# Patient Record
Sex: Male | Born: 1960
Health system: Southern US, Community
[De-identification: ages and names within clinical notes are randomized; demographics above are authoritative.]

## PROBLEM LIST (undated history)

## (undated) DIAGNOSIS — E785 Hyperlipidemia, unspecified: Secondary | ICD-10-CM

## (undated) DIAGNOSIS — S5401XA Injury of ulnar nerve at forearm level, right arm, initial encounter: Secondary | ICD-10-CM

## (undated) DIAGNOSIS — G4733 Obstructive sleep apnea (adult) (pediatric): Principal | ICD-10-CM

## (undated) DIAGNOSIS — I10 Essential (primary) hypertension: Secondary | ICD-10-CM

## (undated) DIAGNOSIS — S5410XA Injury of median nerve at forearm level, unspecified arm, initial encounter: Secondary | ICD-10-CM

## (undated) HISTORY — PX: CARPAL TUNNEL WITH CUBITAL TUNNEL: SHX5608

## (undated) HISTORY — PX: CARPAL TUNNEL RELEASE: SHX101

## (undated) HISTORY — DX: Injury of median nerve at forearm level, unspecified arm, initial encounter: S54.10XA

## (undated) HISTORY — DX: Hyperlipidemia, unspecified: E78.5

## (undated) HISTORY — DX: Injury of ulnar nerve at forearm level, right arm, initial encounter: S54.01XA

## (undated) HISTORY — PX: HERNIA REPAIR: SHX51

## (undated) HISTORY — DX: Obstructive sleep apnea (adult) (pediatric): G47.33

## (undated) HISTORY — PX: TOTAL HIP ARTHROPLASTY: SHX124

## (undated) HISTORY — PX: BACK SURGERY: SHX140

---

## 2011-12-13 ENCOUNTER — Emergency Department (INDEPENDENT_AMBULATORY_CARE_PROVIDER_SITE_OTHER)
Admission: EM | Admit: 2011-12-13 | Discharge: 2011-12-13 | Disposition: A | Discharge status: Home / Self Care | Payer: 59 | Source: Home / Self Care

## 2011-12-13 ENCOUNTER — Encounter: Payer: Self-pay | Admitting: *Deleted

## 2011-12-13 DIAGNOSIS — J069 Acute upper respiratory infection, unspecified: Secondary | ICD-10-CM

## 2011-12-13 MED ORDER — AZITHROMYCIN 250 MG PO TABS: ORAL_TABLET | ORAL | Status: AC

## 2011-12-13 NOTE — ED Provider Notes (Signed)
History     CSN: [id number]  Arrival date & time 12/13/11  1015   First MD Initiated Contact with Patient 12/13/11 1047      Chief Complaint  Patient presents with  . Nasal Congestion  . Cough    (Consider location/radiation/quality/duration/timing/severity/associated sxs/prior treatment) HPI Glen Chambers is a 51 y.o. male who complains of onset of cold symptoms for 7 days.  The symptoms are constant and mild-moderate in severity. + sore throat +/- cough No pleuritic pain No wheezing + nasal congestion + post-nasal drainage + sinus pain/pressure No chest congestion No itchy/red eyes No earache No hemoptysis No SOB No chills/sweats No fever No nausea No vomiting No abdominal pain No diarrhea No skin rashes No fatigue No myalgias + headache     History reviewed. No pertinent past medical history.  History reviewed. No pertinent past surgical history.  History reviewed. No pertinent family history.  History  Substance Use Topics  . Smoking status: Never Smoker   . Smokeless tobacco: Not on file  . Alcohol Use: Yes      Review of Systems  All other systems reviewed and are negative.    Allergies  Review of patient's allergies indicates no known allergies.  Home Medications   Current Outpatient Rx  Name Route Sig Dispense Refill  . AZITHROMYCIN 250 MG PO TABS  Use as directed 1 each 0    BP 126/86  Pulse 83  Temp 98.9 F (37.2 C) (Oral)  Resp 16  Ht 5\' 9"  (1.753 m)  Wt 205 lb 8 oz (93.214 kg)  BMI 30.35 kg/m2  SpO2 98%  Physical Exam  Nursing note and vitals reviewed. Constitutional: He is oriented to person, place, and time. He appears well-developed and well-nourished.  HENT:  Head: Normocephalic and atraumatic.  Right Ear: Tympanic membrane, external ear and ear canal normal.  Left Ear: Tympanic membrane, external ear and ear canal normal.  Nose: Mucosal edema and rhinorrhea present.  Mouth/Throat: Posterior oropharyngeal erythema  present. No oropharyngeal exudate or posterior oropharyngeal edema.  Eyes: No scleral icterus.  Neck: Neck supple.  Cardiovascular: Regular rhythm and normal heart sounds.   Pulmonary/Chest: Effort normal and breath sounds normal. No respiratory distress.  Neurological: He is alert and oriented to person, place, and time.  Skin: Skin is warm and dry.  Psychiatric: He has a normal mood and affect. His speech is normal.    ED Course  Procedures (including critical care time)  Labs Reviewed - No data to display No results found.   1. Acute upper respiratory infections of unspecified site       MDM  1)  Take the prescribed antibiotic as instructed. 2)  Use nasal saline solution (over the counter) at least 3 times a day. 3)  Use over the counter decongestants like Zyrtec-D every 12 hours as needed to help with congestion.  If you have hypertension, do not take medicines with sudafed.  4)  Can take tylenol every 6 hours or motrin every 8 hours for pain or fever. 5)  Follow up with your primary doctor if no improvement in 5-7 days, sooner if increasing pain, fever, or new symptoms.     Marlaine Hind, MD 12/13/11 1058

## 2011-12-13 NOTE — ED Notes (Signed)
Pt has had a head cold with pressure, nasal congestion, sore throat, and productive cough for 1 wk.

## 2012-02-20 ENCOUNTER — Emergency Department
Admission: EM | Admit: 2012-02-20 | Discharge: 2012-02-20 | Disposition: A | Discharge status: Home / Self Care | Payer: 59 | Source: Home / Self Care

## 2012-02-20 ENCOUNTER — Encounter: Payer: Self-pay | Admitting: Emergency Medicine

## 2012-02-20 NOTE — ED Notes (Signed)
Left jaw swelling today at lunch, pressure no pain

## 2015-10-01 LAB — HEPATIC FUNCTION PANEL

## 2015-10-01 LAB — BASIC METABOLIC PANEL

## 2015-10-01 LAB — CBC AND DIFFERENTIAL

## 2015-10-02 LAB — HEPATIC FUNCTION PANEL

## 2015-10-02 LAB — BASIC METABOLIC PANEL

## 2015-10-03 LAB — HEPATIC FUNCTION PANEL
ALT: 42 U/L — AB (ref 10–40)
AST: 89 U/L — AB (ref 14–40)
Alkaline Phosphatase: 72 U/L (ref 25–125)
Bilirubin, Total: 1.7 mg/dL

## 2015-10-03 LAB — BASIC METABOLIC PANEL
BUN: 11 mg/dL (ref 4–21)
CREATININE: 0.6 mg/dL (ref 0.6–1.3)
Glucose: 131 mg/dL
POTASSIUM: 2.8 mmol/L — AB (ref 3.4–5.3)
SODIUM: 136 mmol/L — AB (ref 137–147)

## 2015-10-03 LAB — CBC AND DIFFERENTIAL
HCT: 41 % (ref 41–53)
HEMOGLOBIN: 15 g/dL (ref 13.5–17.5)
PLATELETS: 84 10*3/uL — AB (ref 150–399)
WBC: 7.1 10*3/mL

## 2015-10-04 LAB — CBC AND DIFFERENTIAL
HEMATOCRIT: 41 % (ref 41–53)
Hemoglobin: 14.8 g/dL (ref 13.5–17.5)
PLATELETS: 90 10*3/uL — AB (ref 150–399)
WBC: 6.7 10*3/mL

## 2015-10-04 LAB — BASIC METABOLIC PANEL
BUN: 8 mg/dL (ref 4–21)
CREATININE: 0.5 mg/dL — AB (ref 0.6–1.3)
GLUCOSE: 113 mg/dL
Potassium: 3.1 mmol/L — AB (ref 3.4–5.3)
Sodium: 132 mmol/L — AB (ref 137–147)

## 2015-10-05 LAB — BASIC METABOLIC PANEL

## 2015-10-05 LAB — CBC AND DIFFERENTIAL
HCT: 42 % (ref 41–53)
Hemoglobin: 15.2 g/dL (ref 13.5–17.5)
Platelets: 100 10*3/uL — AB (ref 150–399)
WBC: 6.2 10*3/mL

## 2015-11-01 ENCOUNTER — Encounter: Payer: Self-pay | Admitting: Internal Medicine

## 2015-11-01 DIAGNOSIS — D696 Thrombocytopenia, unspecified: Secondary | ICD-10-CM | POA: Insufficient documentation

## 2015-11-01 DIAGNOSIS — R531 Weakness: Secondary | ICD-10-CM | POA: Insufficient documentation

## 2015-11-01 DIAGNOSIS — F339 Major depressive disorder, recurrent, unspecified: Secondary | ICD-10-CM | POA: Insufficient documentation

## 2015-11-01 DIAGNOSIS — I4891 Unspecified atrial fibrillation: Secondary | ICD-10-CM | POA: Insufficient documentation

## 2015-11-01 DIAGNOSIS — E876 Hypokalemia: Secondary | ICD-10-CM | POA: Insufficient documentation

## 2015-11-01 DIAGNOSIS — F10239 Alcohol dependence with withdrawal, unspecified: Secondary | ICD-10-CM | POA: Insufficient documentation

## 2015-11-01 DIAGNOSIS — I6310 Cerebral infarction due to embolism of unspecified precerebral artery: Secondary | ICD-10-CM | POA: Insufficient documentation

## 2015-11-01 DIAGNOSIS — I10 Essential (primary) hypertension: Secondary | ICD-10-CM | POA: Insufficient documentation

## 2015-11-01 NOTE — Progress Notes (Signed)
Opened in error

## 2015-11-20 ENCOUNTER — Ambulatory Visit: Payer: Self-pay | Admitting: Family Medicine

## 2016-07-09 DIAGNOSIS — M25562 Pain in left knee: Secondary | ICD-10-CM | POA: Diagnosis not present

## 2016-07-09 DIAGNOSIS — M25561 Pain in right knee: Secondary | ICD-10-CM | POA: Diagnosis not present

## 2016-08-30 DIAGNOSIS — J069 Acute upper respiratory infection, unspecified: Secondary | ICD-10-CM | POA: Diagnosis not present

## 2016-09-11 ENCOUNTER — Encounter: Payer: Self-pay | Admitting: Family Medicine

## 2016-09-11 ENCOUNTER — Ambulatory Visit (INDEPENDENT_AMBULATORY_CARE_PROVIDER_SITE_OTHER): Payer: 59 | Admitting: Family Medicine

## 2016-09-11 VITALS — BP 137/73 | HR 85 | Temp 98.6°F | Ht 72.0 in | Wt 213.0 lb

## 2016-09-11 DIAGNOSIS — Z125 Encounter for screening for malignant neoplasm of prostate: Secondary | ICD-10-CM | POA: Diagnosis not present

## 2016-09-11 DIAGNOSIS — Z0000 Encounter for general adult medical examination without abnormal findings: Secondary | ICD-10-CM | POA: Diagnosis not present

## 2016-09-11 DIAGNOSIS — G4733 Obstructive sleep apnea (adult) (pediatric): Secondary | ICD-10-CM | POA: Insufficient documentation

## 2016-09-11 DIAGNOSIS — R6882 Decreased libido: Secondary | ICD-10-CM | POA: Diagnosis not present

## 2016-09-11 DIAGNOSIS — Z1211 Encounter for screening for malignant neoplasm of colon: Secondary | ICD-10-CM | POA: Diagnosis not present

## 2016-09-11 DIAGNOSIS — R0683 Snoring: Secondary | ICD-10-CM | POA: Diagnosis not present

## 2016-09-11 HISTORY — DX: Obstructive sleep apnea (adult) (pediatric): G47.33

## 2016-09-11 MED ORDER — SILDENAFIL CITRATE 20 MG PO TABS: 20.0000 mg | ORAL_TABLET | ORAL | 11 refills | Status: DC | PRN

## 2016-09-11 NOTE — Progress Notes (Signed)
Glen Chambers is a 56 y.o. male who presents to [place]: [place] today for establish care and discuss fatigue and decreased libido. Patient is in good health. He exercises regularly. He notes of the past several years he's had worsening fatigue and decreased libido and some erection dysfunction. He denies any fevers or chills nausea vomiting or diarrhea. He feels well otherwise. Additionally he notes that he snores and has been told that he stops breathing at night. He is concerned he may have sleep apnea.    Past Medical History:  Diagnosis Date  . Median nerve injury   . Ulnar nerve damage, right, initial encounter    Past Surgical History:  Procedure Laterality Date  . CARPAL TUNNEL RELEASE    . CARPAL TUNNEL WITH CUBITAL TUNNEL    . HERNIA REPAIR     Social History  Substance Use Topics  . Smoking status: Never Smoker  . Smokeless tobacco: Never Used  . Alcohol use 3.0 - 6.0 oz/week    5 - 10 Cans of beer per week   family history includes Heart disease in his father; Hypertension in his father and mother.  ROS as above:  Medications: Current Outpatient Prescriptions  Medication Sig Dispense Refill  . sildenafil (REVATIO) 20 MG tablet Take 1 tablet (20 mg total) by mouth as needed. 50 tablet 11   No current facility-administered medications for this visit.    No Known Allergies  Health Maintenance Health Maintenance  Topic Date Due  . Hepatitis C Screening  [birth year]  . HIV Screening  10/17/1975  . TETANUS/TDAP  10/17/1979  . COLONOSCOPY  10/17/2010  . INFLUENZA VACCINE  01/15/2016     Exam:  BP 137/73   Pulse 85   Temp 98.6 F (37 C)   Ht 6' (1.829 m)   Wt 213 lb (96.6 kg)   SpO2 99%   BMI 28.89 kg/m  Gen: Well NAD HEENT: EOMI,  MMM low riding palate Lungs: Normal work of breathing. CTABL Heart: RRR no MRG Abd: NABS, Soft.  Nondistended, Nontender Exts: Brisk capillary refill, warm and well perfused. Right hand decreased thenar and hyperthenar bulk. Hand of benediction/ ape hand pattern.     No results found for this or any previous visit (from the past 72 hour(s)). No results found.    Assessment and Plan: 56 y.o. male with  Well adult visit. Patient has several issues wished addressed today including colon cancer screening basic fasting labs checking testosterone for decreased libido. Additionally will screen for prostate cancer with PSA test and refer to gastroenterology for colon cancer screening. Will additionally order a sleep study for potential sleep apnea. We'll prescribe sildenafil for erectile dysfunction.   Orders Placed This Encounter  Procedures  . CBC  . COMPLETE METABOLIC PANEL WITH GFR  . Lipid Panel w/reflex Direct LDL  . Hemoglobin A1c  . VITAMIN D 25 Hydroxy (Vit-D Deficiency, Fractures)  . TSH  . PSA  . Testosterone  . Ambulatory referral to Gastroenterology    Referral Priority:   Routine    Referral Type:   Consultation    Referral Reason:   Specialty Services Required    Requested Specialty:   Gastroenterology    Number of Visits Requested:   1  . Home sleep test    Standing Status:   Future    Standing Expiration Date:   09/11/2017    Order Specific Question:   Where should this  test be performed:    Answer:   [facility] ordered this encounter  Medications  . sildenafil (REVATIO) 20 MG tablet    Sig: Take 1 tablet (20 mg total) by mouth as needed.    Dispense:  50 tablet    Refill:  11     Discussed warning signs or symptoms. Please see discharge instructions. Patient expresses understanding.

## 2016-09-11 NOTE — Patient Instructions (Signed)
Thank you for coming in today. Get morning fasting labs next week,  You should hear from digestive health as well as the sleep center.  I will get results to you ASAP.  You can send me the labs from you previous test to fax [phone]   Use viagra as needed.

## 2016-09-15 LAB — COMPLETE METABOLIC PANEL WITH GFR
ALT: 27 U/L (ref 9–46)
AST: 23 U/L (ref 10–35)
Albumin: 4.1 g/dL (ref 3.6–5.1)
Alkaline Phosphatase: 50 U/L (ref 40–115)
BILIRUBIN TOTAL: 0.6 mg/dL (ref 0.2–1.2)
BUN: 22 mg/dL (ref 7–25)
CHLORIDE: 106 mmol/L (ref 98–110)
CO2: 25 mmol/L (ref 20–31)
CREATININE: 1.05 mg/dL (ref 0.70–1.33)
Calcium: 8.8 mg/dL (ref 8.6–10.3)
GFR, Est Non African American: 80 mL/min (ref >=60)
GLUCOSE: 101 mg/dL — AB (ref 65–99)
Potassium: 4.7 mmol/L (ref 3.5–5.3)
Sodium: 141 mmol/L (ref 135–146)
TOTAL PROTEIN: 6.4 g/dL (ref 6.1–8.1)

## 2016-09-15 LAB — CBC
HCT: 44.8 % (ref 38.5–50.0)
Hemoglobin: 15.6 g/dL (ref 13.2–17.1)
MCH: 32.6 pg (ref 27.0–33.0)
MCHC: 34.8 g/dL (ref 32.0–36.0)
MCV: 93.5 fL (ref 80.0–100.0)
MPV: 10 fL (ref 7.5–12.5)
PLATELETS: 206 10*3/uL (ref 140–400)
RBC: 4.79 MIL/uL (ref 4.20–5.80)
RDW: 12.8 % (ref 11.0–15.0)
WBC: 6.8 10*3/uL (ref 3.8–10.8)

## 2016-09-15 LAB — LIPID PANEL W/REFLEX DIRECT LDL
Cholesterol: 181 mg/dL (ref <200)
HDL: 48 mg/dL (ref >40)
LDL-CHOLESTEROL: 113 mg/dL — AB
Non-HDL Cholesterol (Calc): 133 mg/dL — ABNORMAL HIGH (ref <130)
Total CHOL/HDL Ratio: 3.8 Ratio (ref <5.0)
Triglycerides: 96 mg/dL (ref <150)

## 2016-09-16 LAB — HEMOGLOBIN A1C
HEMOGLOBIN A1C: 5.2 % (ref <5.7)
MEAN PLASMA GLUCOSE: 103 mg/dL

## 2016-09-16 LAB — PSA: PSA: 1.5 ng/mL (ref <=4.0)

## 2016-09-16 LAB — VITAMIN D 25 HYDROXY (VIT D DEFICIENCY, FRACTURES): VIT D 25 HYDROXY: 47 ng/mL (ref 30–100)

## 2016-09-16 LAB — TESTOSTERONE: Testosterone: 299 ng/dL (ref 250–827)

## 2016-09-16 LAB — TSH: TSH: 0.63 mIU/L (ref 0.40–4.50)

## 2016-09-18 ENCOUNTER — Encounter: Payer: Self-pay | Admitting: Family Medicine

## 2016-09-18 DIAGNOSIS — R7989 Other specified abnormal findings of blood chemistry: Secondary | ICD-10-CM

## 2016-10-02 LAB — LUTEINIZING HORMONE: LH: 1 m[IU]/mL — AB (ref 1.5–9.3)

## 2016-10-02 LAB — FOLLICLE STIMULATING HORMONE: FSH: 2.4 m[IU]/mL (ref 1.6–8.0)

## 2016-10-03 LAB — TESTOSTERONE TOTAL,FREE,BIO, MALES
Albumin: 4.2 g/dL (ref 3.6–5.1)
SEX HORMONE BINDING: 26 nmol/L (ref 10–50)
Testosterone: 203 ng/dL — ABNORMAL LOW (ref 250–827)

## 2016-10-15 ENCOUNTER — Encounter: Payer: Self-pay | Admitting: Family Medicine

## 2016-10-15 ENCOUNTER — Ambulatory Visit (INDEPENDENT_AMBULATORY_CARE_PROVIDER_SITE_OTHER): Payer: 59 | Admitting: Family Medicine

## 2016-10-15 VITALS — BP 144/78 | HR 83 | Wt 219.0 lb

## 2016-10-15 DIAGNOSIS — R9431 Abnormal electrocardiogram [ECG] [EKG]: Secondary | ICD-10-CM | POA: Diagnosis not present

## 2016-10-15 DIAGNOSIS — R030 Elevated blood-pressure reading, without diagnosis of hypertension: Secondary | ICD-10-CM | POA: Insufficient documentation

## 2016-10-15 DIAGNOSIS — R7989 Other specified abnormal findings of blood chemistry: Secondary | ICD-10-CM | POA: Diagnosis not present

## 2016-10-15 DIAGNOSIS — R6882 Decreased libido: Secondary | ICD-10-CM

## 2016-10-15 MED ORDER — AMBULATORY NON FORMULARY MEDICATION: 0 refills | Status: AC

## 2016-10-15 MED ORDER — TESTOSTERONE CYPIONATE 200 MG/ML IM SOLN: 200.0000 mg | INTRAMUSCULAR | 0 refills | Status: DC

## 2016-10-15 NOTE — Patient Instructions (Signed)
Thank you for coming in today. Get the testoserone.  Schedule a nurse visit to learn how to do your own injections.  We will do an EKG and ECHO starting today to workup your heart.  Lets check back about 4-6 weeks after starting testosterone.   I will get results back you when I hear back about the sleep study and Colonscopy.  We will go over these results in person as well.

## 2016-10-15 NOTE — Addendum Note (Signed)
Addended by: Huel Cote on: 10/15/2016 04:12 PM   Modules accepted: Orders

## 2016-10-15 NOTE — Progress Notes (Signed)
Glen Chambers is a 56 y.o. male who presents to [place]: [place] today for follow-up low testosterone and discuss cardiac risk.  Low testosterone: Patient noted decreased libido and fatigue any previous visit. His testosterone was checked and rechecked confirming low testosterone levels. LH and FSH are either lower normal as well. He's been using Viagra for erectile dysfunction which do help. He is interested in starting testosterone replacement therapy.  Cardiac risks: Patient has a family history of cardiac disease. He exercises regularly. He notes when he measures his blood pressure daily at home his blood pressure is rarely above 563 systolic. He denies chest pain or palpitations and is able to exercise without any limitations. He snores and is currently scheduled for a home sleep study in the near future.   Past Medical History:  Diagnosis Date  . Median nerve injury   . Ulnar nerve damage, right, initial encounter    Past Surgical History:  Procedure Laterality Date  . CARPAL TUNNEL RELEASE    . CARPAL TUNNEL WITH CUBITAL TUNNEL    . HERNIA REPAIR     Social History  Substance Use Topics  . Smoking status: Never Smoker  . Smokeless tobacco: Never Used  . Alcohol use 3.0 - 6.0 oz/week    5 - 10 Cans of beer per week   family history includes Heart disease in his father; Hypertension in his father and mother.  ROS as above:  Medications: Current Outpatient Prescriptions  Medication Sig Dispense Refill  . sildenafil (REVATIO) 20 MG tablet Take 1 tablet (20 mg total) by mouth as needed. 50 tablet 11  . AMBULATORY NON FORMULARY MEDICATION 57ml syringe, 21g 1.5 inch needles and 18 gauge blunt filler needles for testosterone injections every 2 weeks. Disp qs x 3 months. 1 each 0  . testosterone cypionate (DEPOTESTOSTERONE CYPIONATE) 200 MG/ML injection Inject 1 mL  (200 mg total) into the muscle every 14 (fourteen) days. 10 mL 0   No current facility-administered medications for this visit.    No Known Allergies  Health Maintenance Health Maintenance  Topic Date Due  . Hepatitis C Screening  [birth year]  . HIV Screening  10/17/1975  . TETANUS/TDAP  10/17/1979  . COLONOSCOPY  10/17/2010  . INFLUENZA VACCINE  01/14/2017     Exam:  BP (!) 144/78   Pulse 83   Wt 219 lb (99.3 kg)   BMI 29.70 kg/m  Gen: Well NAD HEENT: EOMI,  MMM Lungs: Normal work of breathing. CTABL Heart: RRR no MRG Abd: NABS, Soft. Nondistended, Nontender Exts: Brisk capillary refill, warm and well perfused.   12-lead EKG shows normal sinus rhythm with a rate of 74 bpm. Left axis deviation partial right bundle branch block and flattened lateral precordial T waves. No Q waves or ST segment elevation. No prior studies to compare to.  Lab Results  Component Value Date   TESTOSTERONE 203 (L) 10/02/2016     No results found for this or any previous visit (from the past 72 hour(s)). No results found.    Assessment and Plan: 56 y.o. male with  Low testosterone symptomatic. Start testosterone replacement therapy. We'll recheck in about a month for efficacy. We'll continue laboratory monitoring.  Cardiac risks: Patient has mildly elevated blood pressure and a family history of cardiac disease. He has an abnormal EKG today. Plan for echocardiogram in the near future. Likely will obtain a stress test as well based on echocardiogram results. Additionally  we will await results of sleep study as I think this is a contributing factor. Will control blood pressure if needed based on home blood pressure logs in the near future.  Recheck in about a month or 2.    Orders Placed This Encounter  Procedures  . ECHOCARDIOGRAM COMPLETE    Standing Status:   Future    Standing Expiration Date:   01/15/2018    Order Specific Question:   Where should this test be performed    Answer:    [facility]    Order Specific Question:   Perflutren DEFINITY (image enhancing agent) should be administered unless hypersensitivity or allergy exist    Answer:   Administer Perflutren    Order Specific Question:   Expected Date:    Answer:   1 month   Meds ordered this encounter  Medications  . AMBULATORY NON FORMULARY MEDICATION    Sig: 21ml syringe, 21g 1.5 inch needles and 18 gauge blunt filler needles for testosterone injections every 2 weeks. Disp qs x 3 months.    Dispense:  1 each    Refill:  0  . testosterone cypionate (DEPOTESTOSTERONE CYPIONATE) 200 MG/ML injection    Sig: Inject 1 mL (200 mg total) into the muscle every 14 (fourteen) days.    Dispense:  10 mL    Refill:  0     Discussed warning signs or symptoms. Please see discharge instructions. Patient expresses understanding.  I spent 40 minutes with this patient, greater than 50% was face-to-face time counseling regarding the above diagnosis.

## 2016-10-16 ENCOUNTER — Telehealth: Payer: Self-pay | Admitting: *Deleted

## 2016-10-16 NOTE — Telephone Encounter (Signed)
A message was left for Glen Chambers, to call and schedule this test

## 2016-10-21 ENCOUNTER — Ambulatory Visit (INDEPENDENT_AMBULATORY_CARE_PROVIDER_SITE_OTHER): Payer: 59 | Admitting: Family Medicine

## 2016-10-21 DIAGNOSIS — R7989 Other specified abnormal findings of blood chemistry: Secondary | ICD-10-CM

## 2016-10-21 NOTE — Progress Notes (Signed)
Glen Chambers presents to the clinic for testosterone self administer.  Denies SOB, chest pain, headache, and mood changes.  [name] came to his appointment with all his supplies.  He gets 200 mg (1 mL) every 14 days.  He was taught to sterilize himself as well as the medications vial before use.  He was given instructions to draw up the medication with the 18 1 1/2 g needle and to self inject with the 22 1 1/2 g needle.  Pt directed to self inject himself at a 90 degree angle in the upper outer thigh.  Pt tolerated injection well.  Also pt blood pressure was elevated during this visit the first reading read 162/94 and after sitting for 5-10 minutes it read 186/107.  These number where brought to Dr. Clovis Riley attention and he suggest that pt make a follow up appointment concerning his blood pressure with him in about 2 weeks.  Pt verbalized understanding.  No further questions. -EH/RMA

## 2016-10-28 ENCOUNTER — Ambulatory Visit (HOSPITAL_BASED_OUTPATIENT_CLINIC_OR_DEPARTMENT_OTHER): Payer: 59 | Attending: Family Medicine | Admitting: Internal Medicine

## 2016-10-28 ENCOUNTER — Ambulatory Visit (HOSPITAL_COMMUNITY): Payer: 59 | Attending: Cardiology

## 2016-10-28 ENCOUNTER — Other Ambulatory Visit: Payer: Self-pay

## 2016-10-28 VITALS — BP 166/109

## 2016-10-28 DIAGNOSIS — G4736 Sleep related hypoventilation in conditions classified elsewhere: Secondary | ICD-10-CM | POA: Diagnosis not present

## 2016-10-28 DIAGNOSIS — I340 Nonrheumatic mitral (valve) insufficiency: Secondary | ICD-10-CM | POA: Insufficient documentation

## 2016-10-28 DIAGNOSIS — G4733 Obstructive sleep apnea (adult) (pediatric): Secondary | ICD-10-CM | POA: Insufficient documentation

## 2016-10-28 DIAGNOSIS — R0683 Snoring: Secondary | ICD-10-CM | POA: Insufficient documentation

## 2016-10-28 DIAGNOSIS — R9431 Abnormal electrocardiogram [ECG] [EKG]: Secondary | ICD-10-CM | POA: Diagnosis not present

## 2016-10-28 DIAGNOSIS — I517 Cardiomegaly: Secondary | ICD-10-CM | POA: Insufficient documentation

## 2016-10-28 DIAGNOSIS — R6882 Decreased libido: Secondary | ICD-10-CM

## 2016-10-28 DIAGNOSIS — R030 Elevated blood-pressure reading, without diagnosis of hypertension: Secondary | ICD-10-CM | POA: Diagnosis not present

## 2016-10-28 MED ORDER — PERFLUTREN LIPID MICROSPHERE
1.0000 mL | INTRAVENOUS | Status: AC | PRN
  Administered 2016-10-28: 1 mL via INTRAVENOUS

## 2016-10-29 ENCOUNTER — Encounter: Payer: Self-pay | Admitting: Family Medicine

## 2016-11-01 NOTE — Procedures (Signed)
   Patient Name: Glen Chambers, Glen Chambers Date: 10/28/2016 Gender: Male D.O.B: Jul 03, 1960 Age (years): 56 Referring Provider: Gregor Hams Height (inches): 48 Interpreting Physician: Baird Lyons MD, ABSM Weight (lbs): 210 RPSGT: Jacolyn Reedy BMI: 31 MRN: [id number] Neck Size: 18.00 CLINICAL INFORMATION Sleep Study Type: unattended HST     Indication for sleep study: Snoring     Epworth Sleepiness Score: 10  SLEEP STUDY TECHNIQUE A multi-channel overnight portable sleep study was performed. The channels recorded were: nasal airflow, thoracic respiratory movement, and oxygen saturation with a pulse oximetry. Snoring was also monitored.  MEDICATIONS Patient self administered medications include: none reported.  SLEEP ARCHITECTURE Patient was studied for 321.4 minutes. The sleep efficiency was 99.2 % and the patient was supine for 42.6%. The arousal index was 0.0 per hour.  RESPIRATORY PARAMETERS The overall AHI was 46.5 per hour, with a central apnea index of 0.0 per hour.  The oxygen nadir was 82% during sleep.  CARDIAC DATA Mean heart rate during sleep was 73.9 bpm.  IMPRESSIONS - Severe obstructive sleep apnea occurred during this study (AHI = 46.5/h). - No significant central sleep apnea occurred during this study (CAI = 0.0/h). - Moderate oxygen desaturation was noted during this study (Min O2 = 82%). - Patient snored.  DIAGNOSIS - Obstructive Sleep Apnea (327.23 [G47.33 ICD-10]) - Nocturnal Hypoxemia (327.26 [G47.36 ICD-10])  RECOMMENDATIONS - Recommend CPAP titration. Other options based on clinical judgment. - Positional therapy avoiding supine position during sleep. - Avoid alcohol, sedatives and other CNS depressants that may worsen sleep apnea and disrupt normal sleep architecture. - Sleep hygiene should be reviewed to assess factors that may improve sleep quality. - Weight management and regular exercise should be initiated or  continued.  [Electronically signed] 11/01/2016 10:17 AM  Baird Lyons MD, ABSM Diplomate, American Board of Sleep Medicine   NPI: [id number]  [place], American Board of Sleep Medicine  ELECTRONICALLY SIGNED ON:  11/01/2016, 10:15 AM [place] PH: [phone]   FX: [phone] [place]

## 2016-11-03 ENCOUNTER — Telehealth: Payer: Self-pay | Admitting: Family Medicine

## 2016-11-03 ENCOUNTER — Encounter: Payer: Self-pay | Admitting: Family Medicine

## 2016-11-03 DIAGNOSIS — G4733 Obstructive sleep apnea (adult) (pediatric): Secondary | ICD-10-CM

## 2016-11-03 MED ORDER — AMBULATORY NON FORMULARY MEDICATION: 0 refills | Status: DC

## 2016-11-03 NOTE — Telephone Encounter (Signed)
Sleep Study shows severe sleep apnea.  We will get a CPAP machine ordered. You should hear soon from the sleep company.  Please let me know if you do not hear anything by Thursday.  Glen Chambers

## 2016-11-04 NOTE — Telephone Encounter (Signed)
CPAP order, sleep study, demographics, insurance, and OV notes faxed to [employer] [facility] at [phone]. Pt notified.

## 2016-11-11 DIAGNOSIS — G4733 Obstructive sleep apnea (adult) (pediatric): Secondary | ICD-10-CM | POA: Diagnosis not present

## 2016-12-12 DIAGNOSIS — G4733 Obstructive sleep apnea (adult) (pediatric): Secondary | ICD-10-CM | POA: Diagnosis not present

## 2017-01-01 ENCOUNTER — Encounter: Payer: Self-pay | Admitting: Family Medicine

## 2017-01-01 DIAGNOSIS — R7989 Other specified abnormal findings of blood chemistry: Secondary | ICD-10-CM

## 2017-01-02 MED ORDER — TESTOSTERONE CYPIONATE 200 MG/ML IM SOLN: 200.0000 mg | INTRAMUSCULAR | 0 refills | Status: DC

## 2017-01-04 ENCOUNTER — Other Ambulatory Visit: Payer: Self-pay | Admitting: Family Medicine

## 2017-01-11 DIAGNOSIS — G4733 Obstructive sleep apnea (adult) (pediatric): Secondary | ICD-10-CM | POA: Diagnosis not present

## 2017-01-19 LAB — COMPLETE METABOLIC PANEL WITH GFR
ALT: 32 U/L (ref 9–46)
AST: 28 U/L (ref 10–35)
Albumin: 4.1 g/dL (ref 3.6–5.1)
Alkaline Phosphatase: 56 U/L (ref 40–115)
BILIRUBIN TOTAL: 1.1 mg/dL (ref 0.2–1.2)
BUN: 18 mg/dL (ref 7–25)
CHLORIDE: 104 mmol/L (ref 98–110)
CO2: 26 mmol/L (ref 20–32)
CREATININE: 1.13 mg/dL (ref 0.70–1.33)
Calcium: 9.1 mg/dL (ref 8.6–10.3)
GFR, Est African American: 84 mL/min (ref >=60)
GFR, Est Non African American: 72 mL/min (ref >=60)
GLUCOSE: 88 mg/dL (ref 65–99)
Potassium: 4.5 mmol/L (ref 3.5–5.3)
SODIUM: 140 mmol/L (ref 135–146)
Total Protein: 6.5 g/dL (ref 6.1–8.1)

## 2017-01-19 LAB — CBC
HCT: 52.4 % — ABNORMAL HIGH (ref 38.5–50.0)
Hemoglobin: 17.8 g/dL — ABNORMAL HIGH (ref 13.2–17.1)
MCH: 32.6 pg (ref 27.0–33.0)
MCHC: 34 g/dL (ref 32.0–36.0)
MCV: 96 fL (ref 80.0–100.0)
MPV: 10.2 fL (ref 7.5–12.5)
PLATELETS: 197 10*3/uL (ref 140–400)
RBC: 5.46 MIL/uL (ref 4.20–5.80)
RDW: 12.6 % (ref 11.0–15.0)
WBC: 8.3 10*3/uL (ref 3.8–10.8)

## 2017-01-20 LAB — TESTOSTERONE: TESTOSTERONE: 144 ng/dL — AB (ref 250–827)

## 2017-01-20 LAB — PSA: PSA: 1.9 ng/mL (ref <=4.0)

## 2017-01-26 ENCOUNTER — Encounter: Payer: Self-pay | Admitting: Family Medicine

## 2017-01-26 ENCOUNTER — Other Ambulatory Visit: Payer: Self-pay | Admitting: Family Medicine

## 2017-01-26 ENCOUNTER — Ambulatory Visit (INDEPENDENT_AMBULATORY_CARE_PROVIDER_SITE_OTHER): Payer: 59 | Admitting: Family Medicine

## 2017-01-26 VITALS — BP 169/96 | HR 103 | Wt 221.0 lb

## 2017-01-26 DIAGNOSIS — R7989 Other specified abnormal findings of blood chemistry: Secondary | ICD-10-CM | POA: Diagnosis not present

## 2017-01-26 DIAGNOSIS — D582 Other hemoglobinopathies: Secondary | ICD-10-CM | POA: Diagnosis not present

## 2017-01-26 DIAGNOSIS — R6882 Decreased libido: Secondary | ICD-10-CM | POA: Diagnosis not present

## 2017-01-26 DIAGNOSIS — G4733 Obstructive sleep apnea (adult) (pediatric): Secondary | ICD-10-CM | POA: Diagnosis not present

## 2017-01-26 DIAGNOSIS — I10 Essential (primary) hypertension: Secondary | ICD-10-CM

## 2017-01-26 MED ORDER — LISINOPRIL 10 MG PO TABS: 10.0000 mg | ORAL_TABLET | Freq: Every day | ORAL | 1 refills | Status: DC

## 2017-01-26 MED ORDER — TESTOSTERONE CYPIONATE 200 MG/ML IM SOLN: 200.0000 mg | INTRAMUSCULAR | 0 refills | Status: DC

## 2017-01-26 NOTE — Patient Instructions (Addendum)
Thank you for coming in today. Continue the testosterone.  We will recheck testosterone in about 1 month.  Start Lisinopril daily  Get labs in about 1 month.   Send me blood pressure logs for the next month.   We will revisit face to face in about 3 months.   `Lisinopril tablets What is this medicine? LISINOPRIL (lyse IN oh pril) is an ACE inhibitor. This medicine is used to treat high blood pressure and heart failure. It is also used to protect the heart immediately after a heart attack. This medicine may be used for other purposes; ask your health care provider or pharmacist if you have questions. COMMON BRAND NAME(S): Prinivil, Zestril What should I tell my health care provider before I take this medicine? They need to know if you have any of these conditions: -diabetes -heart or blood vessel disease -kidney disease -low blood pressure -previous swelling of the tongue, face, or lips with difficulty breathing, difficulty swallowing, hoarseness, or tightening of the throat -an unusual or allergic reaction to lisinopril, other ACE inhibitors, insect venom, foods, dyes, or preservatives -pregnant or trying to get pregnant -breast-feeding How should I use this medicine? Take this medicine by mouth with a glass of water. Follow the directions on your prescription label. You may take this medicine with or without food. If it upsets your stomach, take it with food. Take your medicine at regular intervals. Do not take it more often than directed. Do not stop taking except on your doctor's advice. Talk to your pediatrician regarding the use of this medicine in children. Special care may be needed. While this drug may be prescribed for children as young as 22 years of age for selected conditions, precautions do apply. Overdosage: If you think you have taken too much of this medicine contact a poison control center or emergency room at once. NOTE: This medicine is only for you. Do not share this  medicine with others. What if I miss a dose? If you miss a dose, take it as soon as you can. If it is almost time for your next dose, take only that dose. Do not take double or extra doses. What may interact with this medicine? Do not take this medicine with any of the following medications: -hymenoptera venom -sacubitril; valsartan This medicines may also interact with the following medications: -aliskiren -angiotensin receptor blockers, like losartan or valsartan -certain medicines for diabetes -diuretics -everolimus -gold compounds -lithium -NSAIDs, medicines for pain and inflammation, like ibuprofen or naproxen -potassium salts or supplements -salt substitutes -sirolimus -temsirolimus This list may not describe all possible interactions. Give your health care provider a list of all the medicines, herbs, non-prescription drugs, or dietary supplements you use. Also tell them if you smoke, drink alcohol, or use illegal drugs. Some items may interact with your medicine. What should I watch for while using this medicine? Visit your doctor or health care professional for regular check ups. Check your blood pressure as directed. Ask your doctor what your blood pressure should be, and when you should contact him or her. Do not treat yourself for coughs, colds, or pain while you are using this medicine without asking your doctor or health care professional for advice. Some ingredients may increase your blood pressure. Women should inform their doctor if they wish to become pregnant or think they might be pregnant. There is a potential for serious side effects to an unborn child. Talk to your health care professional or pharmacist for more information. Check with  your doctor or health care professional if you get an attack of severe diarrhea, nausea and vomiting, or if you sweat a lot. The loss of too much body fluid can make it dangerous for you to take this medicine. You may get drowsy or  dizzy. Do not drive, use machinery, or do anything that needs mental alertness until you know how this drug affects you. Do not stand or sit up quickly, especially if you are an older patient. This reduces the risk of dizzy or fainting spells. Alcohol can make you more drowsy and dizzy. Avoid alcoholic drinks. Avoid salt substitutes unless you are told otherwise by your doctor or health care professional. What side effects may I notice from receiving this medicine? Side effects that you should report to your doctor or health care professional as soon as possible: -allergic reactions like skin rash, itching or hives, swelling of the hands, feet, face, lips, throat, or tongue -breathing problems -signs and symptoms of kidney injury like trouble passing urine or change in the amount of urine -signs and symptoms of increased potassium like muscle weakness; chest pain; or fast, irregular heartbeat -signs and symptoms of liver injury like dark yellow or brown urine; general ill feeling or flu-like symptoms; light-colored stools; loss of appetite; nausea; right upper belly pain; unusually weak or tired; yellowing of the eyes or skin -signs and symptoms of low blood pressure like dizziness; feeling faint or lightheaded, falls; unusually weak or tired -stomach pain with or without nausea and vomiting Side effects that usually do not require medical attention (report to your doctor or health care professional if they continue or are bothersome): -changes in taste -cough -dizziness -fever -headache -sensitivity to light This list may not describe all possible side effects. Call your doctor for medical advice about side effects. You may report side effects to FDA at 1-800-FDA-1088. Where should I keep my medicine? Keep out of the reach of children. Store at room temperature between 15 and 30 degrees C (59 and 86 degrees F). Protect from moisture. Keep container tightly closed. Throw away any unused medicine  after the expiration date. NOTE: This sheet is a summary. It may not cover all possible information. If you have questions about this medicine, talk to your doctor, pharmacist, or health care provider.  2018 Elsevier/Gold Standard (2015-07-23 12:52:35)

## 2017-01-26 NOTE — Progress Notes (Signed)
Glen Chambers is a 56 y.o. male who presents to [facility] today for follow-up on lab results.   Low testosterone: Testosterone was checked 1 week ago and was low. Patient reports that it was 14 days since his last dose. He is not sure whether or not the testosterone injections are working. His energy is improved, but he notes that this also coincided with the start of his CPAP machine. Patient reports that he has not noticed any change in his libido to date.   Hypertension: Patient has been measuring blood pressure at home and averages 140/88. He brought his machine to clinic and it was confirmed that it is working correctly. Patient has a significant family history of hypertension, including his mom, dad, and brothers. Patient is active and does 40 minutes of cardio and lifts weights five times per week.   Obstructive Sleep Apnea: Patient reports using CPAP machine for 2 months now. It has greatly improved his energy. He reports removing it an average of three times per night, but states that he is becoming more tolerant of it with time.  He uses it nightly.  Erythrocytosis: Hemoglobin was elevated on lab results.   Patient denies any fevers, chills, night sweats, chest pain, dyspnea, abdominal pain, changes in bowel movements, or changes in urination.    Past Medical History:  Diagnosis Date  . Median nerve injury   . OSA (obstructive sleep apnea) 09/11/2016  . Ulnar nerve damage, right, initial encounter    Past Surgical History:  Procedure Laterality Date  . CARPAL TUNNEL RELEASE    . CARPAL TUNNEL WITH CUBITAL TUNNEL    . HERNIA REPAIR     Social History  Substance Use Topics  . Smoking status: Never Smoker  . Smokeless tobacco: Never Used  . Alcohol use 3.0 - 6.0 oz/week    5 - 10 Cans of beer per week   family history includes Heart disease in his  father; Hypertension in his father and mother.  ROS as above:  Medications: Current Outpatient Prescriptions  Medication Sig Dispense Refill  . AMBULATORY NON FORMULARY MEDICATION 54ml syringe, 21g 1.5 inch needles and 18 gauge blunt filler needles for testosterone injections every 2 weeks. Disp qs x 3 months. 1 each 0  . AMBULATORY NON FORMULARY MEDICATION Continuous positive airway pressure (CPAP) machine auto-titrate from 4-20 cm of H2O pressure, with all supplemental supplies as needed. 1 each 0  . B-D 3CC LUER-LOK SYR 21GX1-1/2 21G X 1-1/2" 3 ML MISC USE AS DIRECTED TO ADMINISTER TESTOSTERONE. 6 each 0  . BD HYPODERMIC NEEDLE 18G X 1" MISC USE AS DIRECTED TO ADMINISTER TESTOSTERONE. 6 each 0  . sildenafil (REVATIO) 20 MG tablet Take 1 tablet (20 mg total) by mouth as needed. 50 tablet 11  . testosterone cypionate (DEPOTESTOSTERONE CYPIONATE) 200 MG/ML injection Inject 1 mL (200 mg total) into the muscle every 14 (fourteen) days. 10 mL 0  . lisinopril (PRINIVIL,ZESTRIL) 10 MG tablet Take 1 tablet (10 mg total) by mouth daily. 30 tablet 1   No current facility-administered medications for this visit.    No Known Allergies  Health Maintenance Health Maintenance  Topic Date Due  . Hepatitis C Screening  1961/03/15  . HIV Screening  10/17/1975  . COLONOSCOPY  10/17/2010  . INFLUENZA VACCINE  01/14/2017  . TETANUS/TDAP  10/22/2023     Exam:  BP (!) 169/96   Pulse (!) 103   Wt  221 lb (100.2 kg)   BMI 32.64 kg/m  Gen: Well NAD, pleasant, sitting comfortably in chair HEENT: EOMI,  MMM Lungs: Normal work of breathing. CTABL Heart: RRR, normal S1 and S2, no MRG Abd: NABS, Soft. Nondistended, Nontender Exts: Brisk capillary refill, warm and well perfused.    No results found for this or any previous visit (from the past 72 hour(s)). No results found.    Assessment and Plan: 56 y.o. male who presents for follow-up on lab results   His testosterone was low, however, his last  injection was 14 days prior to testing. He should continue testosterone injections for now and be tested again in 1 month.   For hypertension, it is reasonable to start lisinopril at 10 mg daily. Patient will continue to keep a log of his blood pressure readings and send to clinic.   Patient's fatigue from OSA has improved greatly with CPAP. He will continue to wear it nightly.  Patient has erythrocytosis which is likely due to chronic OSA and testosterone supplementation. He was advised to donate blood. Hemoglobin levels will be rechecked in 1 month when patient returns to recheck his testosterone levels.     Orders Placed This Encounter  Procedures  . COMPLETE METABOLIC PANEL WITH GFR  . Testosterone   Meds ordered this encounter  Medications  . lisinopril (PRINIVIL,ZESTRIL) 10 MG tablet    Sig: Take 1 tablet (10 mg total) by mouth daily.    Dispense:  30 tablet    Refill:  1  . testosterone cypionate (DEPOTESTOSTERONE CYPIONATE) 200 MG/ML injection    Sig: Inject 1 mL (200 mg total) into the muscle every 14 (fourteen) days.    Dispense:  10 mL    Refill:  0     Discussed warning signs or symptoms. Please see discharge instructions. Patient expresses understanding.  I spent 25 minutes with this patient, greater than 50% was face-to-face time counseling regarding the above diagnosis.

## 2017-02-02 ENCOUNTER — Encounter: Payer: Self-pay | Admitting: Family Medicine

## 2017-02-11 DIAGNOSIS — G4733 Obstructive sleep apnea (adult) (pediatric): Secondary | ICD-10-CM | POA: Diagnosis not present

## 2017-03-11 ENCOUNTER — Other Ambulatory Visit: Payer: Self-pay | Admitting: Family Medicine

## 2017-03-11 LAB — COMPLETE METABOLIC PANEL WITH GFR
AG RATIO: 1.8 (calc) (ref 1.0–2.5)
ALT: 24 U/L (ref 9–46)
AST: 24 U/L (ref 10–35)
Albumin: 3.9 g/dL (ref 3.6–5.1)
Alkaline phosphatase (APISO): 49 U/L (ref 40–115)
BUN: 19 mg/dL (ref 7–25)
CALCIUM: 8.6 mg/dL (ref 8.6–10.3)
CO2: 29 mmol/L (ref 20–32)
Chloride: 104 mmol/L (ref 98–110)
Creat: 1.08 mg/dL (ref 0.70–1.33)
GFR, EST NON AFRICAN AMERICAN: 76 mL/min/{1.73_m2} (ref >=60)
GFR, Est African American: 88 mL/min/{1.73_m2} (ref >=60)
GLUCOSE: 95 mg/dL (ref 65–99)
Globulin: 2.2 g/dL (calc) (ref 1.9–3.7)
POTASSIUM: 4.1 mmol/L (ref 3.5–5.3)
Sodium: 139 mmol/L (ref 135–146)
Total Bilirubin: 0.9 mg/dL (ref 0.2–1.2)
Total Protein: 6.1 g/dL (ref 6.1–8.1)

## 2017-03-11 LAB — TESTOSTERONE: Testosterone: 792 ng/dL (ref 250–827)

## 2017-03-11 MED ORDER — LISINOPRIL 10 MG PO TABS: 10.0000 mg | ORAL_TABLET | Freq: Every day | ORAL | 1 refills | Status: DC

## 2017-03-14 DIAGNOSIS — G4733 Obstructive sleep apnea (adult) (pediatric): Secondary | ICD-10-CM | POA: Diagnosis not present

## 2017-04-13 DIAGNOSIS — G4733 Obstructive sleep apnea (adult) (pediatric): Secondary | ICD-10-CM | POA: Diagnosis not present

## 2017-05-14 DIAGNOSIS — G4733 Obstructive sleep apnea (adult) (pediatric): Secondary | ICD-10-CM | POA: Diagnosis not present

## 2017-05-30 ENCOUNTER — Other Ambulatory Visit: Payer: Self-pay | Admitting: Family Medicine

## 2017-06-05 LAB — HM HEPATITIS C SCREENING LAB: HM Hepatitis Screen: NEGATIVE

## 2017-06-05 LAB — HM HIV SCREENING LAB: HM HIV Screening: NEGATIVE

## 2017-06-13 DIAGNOSIS — G4733 Obstructive sleep apnea (adult) (pediatric): Secondary | ICD-10-CM | POA: Diagnosis not present

## 2017-07-01 ENCOUNTER — Other Ambulatory Visit: Payer: Self-pay | Admitting: Family Medicine

## 2017-07-14 DIAGNOSIS — G4733 Obstructive sleep apnea (adult) (pediatric): Secondary | ICD-10-CM | POA: Diagnosis not present

## 2017-07-17 ENCOUNTER — Other Ambulatory Visit: Payer: Self-pay | Admitting: Family Medicine

## 2017-08-13 DIAGNOSIS — G4733 Obstructive sleep apnea (adult) (pediatric): Secondary | ICD-10-CM | POA: Diagnosis not present

## 2017-09-17 ENCOUNTER — Other Ambulatory Visit: Payer: Self-pay | Admitting: Family Medicine

## 2017-10-12 ENCOUNTER — Other Ambulatory Visit: Payer: Self-pay | Admitting: Family Medicine

## 2017-10-27 ENCOUNTER — Encounter: Payer: Self-pay | Admitting: Family Medicine

## 2017-11-12 ENCOUNTER — Other Ambulatory Visit: Payer: Self-pay | Admitting: Family Medicine

## 2017-11-25 ENCOUNTER — Other Ambulatory Visit: Payer: Self-pay | Admitting: Family Medicine

## 2017-11-27 ENCOUNTER — Encounter: Payer: Self-pay | Admitting: Family Medicine

## 2017-11-30 ENCOUNTER — Other Ambulatory Visit: Payer: Self-pay | Admitting: Family Medicine

## 2017-11-30 MED ORDER — TESTOSTERONE CYPIONATE 200 MG/ML IM SOLN: INTRAMUSCULAR | 0 refills | Status: DC

## 2017-12-01 ENCOUNTER — Encounter: Payer: Self-pay | Admitting: Family Medicine

## 2017-12-02 ENCOUNTER — Other Ambulatory Visit: Payer: Self-pay

## 2017-12-02 MED ORDER — LISINOPRIL 10 MG PO TABS: 10.0000 mg | ORAL_TABLET | Freq: Every day | ORAL | 0 refills | Status: DC

## 2017-12-23 ENCOUNTER — Ambulatory Visit (INDEPENDENT_AMBULATORY_CARE_PROVIDER_SITE_OTHER): Payer: 59 | Admitting: Family Medicine

## 2017-12-23 ENCOUNTER — Encounter: Payer: Self-pay | Admitting: Family Medicine

## 2017-12-23 VITALS — BP 157/92 | HR 82 | Ht 68.0 in | Wt 222.0 lb

## 2017-12-23 DIAGNOSIS — R7989 Other specified abnormal findings of blood chemistry: Secondary | ICD-10-CM

## 2017-12-23 DIAGNOSIS — I10 Essential (primary) hypertension: Secondary | ICD-10-CM

## 2017-12-23 DIAGNOSIS — G4733 Obstructive sleep apnea (adult) (pediatric): Secondary | ICD-10-CM

## 2017-12-23 DIAGNOSIS — D582 Other hemoglobinopathies: Secondary | ICD-10-CM | POA: Diagnosis not present

## 2017-12-23 DIAGNOSIS — Z1211 Encounter for screening for malignant neoplasm of colon: Secondary | ICD-10-CM | POA: Diagnosis not present

## 2017-12-23 DIAGNOSIS — F419 Anxiety disorder, unspecified: Secondary | ICD-10-CM | POA: Insufficient documentation

## 2017-12-23 MED ORDER — "NEEDLE (DISP) 18G X 1"" MISC": 0 refills | Status: DC

## 2017-12-23 MED ORDER — LOSARTAN POTASSIUM-HCTZ 100-25 MG PO TABS: 1.0000 | ORAL_TABLET | Freq: Every day | ORAL | 1 refills | Status: DC

## 2017-12-23 MED ORDER — TESTOSTERONE CYPIONATE 200 MG/ML IM SOLN: INTRAMUSCULAR | 0 refills | Status: DC

## 2017-12-23 MED ORDER — "SYRINGE/NEEDLE (DISP) 21G X 1-1/2"" 3 ML MISC": 0 refills | Status: DC

## 2017-12-23 NOTE — Patient Instructions (Signed)
Thank you for coming in today. Research CBT (cognative behavioral therapy). You can do some self guided CBT for anxiety.   We can also use medicine if needed.   Get labs now.   STOP lisinopril.  START losartan 100/25 daily.  If you feel bad or light headed cut it in half.   Recheck in 3 months.    Return sooner if needed.   Send me BP logs through mychart.   Hydrochlorothiazide, HCTZ; Losartan tablets What is this medicine? LOSARTAN; HYDROCHLOROTHIAZIDE (loe SAR tan; hye droe klor oh THYE a zide) is a combination of a drug that relaxes blood vessels and a diuretic. It is used to treat high blood pressure. This medicine may also reduce the risk of stroke in certain patients. This medicine may be used for other purposes; ask your health care provider or pharmacist if you have questions. COMMON BRAND NAME(S): Hyzaar What should I tell my health care provider before I take this medicine? They need to know if you have any of these conditions: -decreased urine -kidney disease -liver disease -if you are on a special diet, like a low-salt diet -immune system problems, like lupus -an unusual or allergic reaction to losartan, hydrochlorothiazide, sulfa drugs, other medicines, foods, dyes, or preservatives -pregnant or trying to get pregnant -breast-feeding How should I use this medicine? Take this medicine by mouth with a glass of water. Follow the directions on the prescription label. You can take it with or without food. If it upsets your stomach, take it with food. Take your medicine at regular intervals. Do not take it more often than directed. Do not stop taking except on your doctor's advice. Talk to your pediatrician regarding the use of this medicine in children. Special care may be needed. Overdosage: If you think you have taken too much of this medicine contact a poison control center or emergency room at once. NOTE: This medicine is only for you. Do not share this medicine with  others. What if I miss a dose? If you miss a dose, take it as soon as you can. If it is almost time for your next dose, take only that dose. Do not take double or extra doses. What may interact with this medicine? -barbiturates, like phenobarbital -blood pressure medicines -celecoxib -cimetidine -corticosteroids -diabetic medicines -diuretics, especially triamterene, spironolactone or amiloride -fluconazole -lithium -NSAIDs, medicines for pain and inflammation, like ibuprofen or naproxen -potassium salts or potassium supplements -prescription pain medicines -rifampin -skeletal muscle relaxants like tubocurarine -some cholesterol-lowering medicines like cholestyramine or colestipol This list may not describe all possible interactions. Give your health care provider a list of all the medicines, herbs, non-prescription drugs, or dietary supplements you use. Also tell them if you smoke, drink alcohol, or use illegal drugs. Some items may interact with your medicine. What should I watch for while using this medicine? Check your blood pressure regularly while you are taking this medicine. Ask your doctor or health care professional what your blood pressure should be and when you should contact him or her. When you check your blood pressure, write down the measurements to show your doctor or health care professional. If you are taking this medicine for a long time, you must visit your health care professional for regular checks on your progress. Make sure you schedule appointments on a regular basis. You must not get dehydrated. Ask your doctor or health care professional how much fluid you need to drink a day. Check with him or her if you get  an attack of severe diarrhea, nausea and vomiting, or if you sweat a lot. The loss of too much body fluid can make it dangerous for you to take this medicine. Women should inform their doctor if they wish to become pregnant or think they might be pregnant.  There is a potential for serious side effects to an unborn child, particularly in the second or third trimester. Talk to your health care professional or pharmacist for more information. You may get drowsy or dizzy. Do not drive, use machinery, or do anything that needs mental alertness until you know how this drug affects you. Do not stand or sit up quickly, especially if you are an older patient. This reduces the risk of dizzy or fainting spells. Alcohol can make you more drowsy and dizzy. Avoid alcoholic drinks. This medicine may affect your blood sugar level. If you have diabetes, check with your doctor or health care professional before changing the dose of your diabetic medicine. Avoid salt substitutes unless you are told otherwise by your doctor or health care professional. Do not treat yourself for coughs, colds, or pain while you are taking this medicine without asking your doctor or health care professional for advice. Some ingredients may increase your blood pressure. What side effects may I notice from receiving this medicine? Side effects that you should report to your doctor or health care professional as soon as possible: -allergic reactions like skin rash, itching or hives, swelling of the face, lips, or tongue -breathing problems -changes in vision -dark urine -eye pain -fast or irregular heart beat, palpitations, or chest pain -feeling faint or lightheaded -muscle cramps -persistent dry cough -redness, blistering, peeling or loosening of the skin, including inside the mouth -stomach pain -trouble passing urine or change in the amount of urine -unusual bleeding or bruising -worsened gout pain -yellowing of the eyes or skin Side effects that usually do not require medical attention (report to your doctor or health care professional if they continue or are bothersome): -change in sex drive or performance -headache This list may not describe all possible side effects. Call your  doctor for medical advice about side effects. You may report side effects to FDA at 1-800-FDA-1088. Where should I keep my medicine? Keep out of the reach of children. Store at room temperature between 15 and 30 degrees C (59 and 86 degrees F). Protect from light. Keep container tightly closed. Throw away any unused medicine after the expiration date. NOTE: This sheet is a summary. It may not cover all possible information. If you have questions about this medicine, talk to your doctor, pharmacist, or health care provider.  2018 Elsevier/Gold Standard (2010-02-20 13:57:32)

## 2017-12-23 NOTE — Progress Notes (Signed)
Glen Chambers is a 57 y.o. male who presents to [facility] today for hypertension sleep apnea testosterone anxiety.  Glen Chambers has hypertension currently treated with 10 mg of lisinopril daily.  He denies chest pain palpitations or shortness of breath.  He denies any significant lightheadedness or dizziness.  He tolerates his medication well.  He notes however his blood pressure is not fully controlled when he checks it at home.  He typically has systolic measurements in the 140s.  Patient notes a mild cough with lisinopril.  Glen Chambers has sleep apnea which is currently well managed with CPAP.  He uses it nightly and feels much more rested.  Testosterone: Glen Chambers has hypogonadism currently managed with injectable testosterone.  He feels as though the testosterone has improved his libido and fatigue.  He tolerates it quite well.  Anxiety: Mother notes some anxiety and irritability.  He notes this is a personality trait from occurring throughout his life even in childhood.  He notes at times he gets somewhat irritable and anxious but is pretty happy with how things are going.  He wonders if there is any things he can do to help manage his anxiety without medications.  He rates his symptoms as mild and not causing severe problems in his life but noticeable.  Elevated hemoglobin: Glen Chambers had mild elevated hemoglobin at last lab check.  He in the interval has increased his CPAP usage and feels pretty well.  Colon cancer screening: Glen Chambers is interested in [facility] for colon cancer screening.  He is asymptomatic with no rectal bleeding or abdominal pain.  ROS as above:  Exam:  BP (!) 157/92   Pulse 82   Ht 5\' 8"  (1.727 m)   Wt 222 lb (100.7 kg)   BMI 33.75 kg/m   Wt Readings from Last 5 Encounters:  12/23/17 222 lb (100.7 kg)  01/26/17 221 lb (100.2 kg)  10/28/16 210 lb (95.3 kg)    10/15/16 219 lb (99.3 kg)  09/11/16 213 lb (96.6 kg)    Gen: Well NAD HEENT: EOMI,  MMM large neck Lungs: Normal work of breathing. CTABL Heart: RRR no MRG Abd: NABS, Soft. Nondistended, Nontender Exts: Brisk capillary refill, warm and well perfused.  Psych alert and oriented normal speech thought process and affect.  Depression screen [facility] 2/9 12/23/2017 01/26/2017 09/11/2016  Decreased Interest 0 0 0  Down, Depressed, Hopeless 0 0 0  PHQ - 2 Score 0 0 0  Altered sleeping 0 - -  Tired, decreased energy 0 - -  Change in appetite 0 - -  Feeling bad or failure about yourself  0 - -  Trouble concentrating 0 - -  Moving slowly or fidgety/restless 0 - -  Suicidal thoughts 0 - -  PHQ-9 Score 0 - -  Difficult doing work/chores Not difficult at all - -   GAD 7 : Generalized Anxiety Score 12/23/2017  Nervous, Anxious, on Edge 1  Control/stop worrying 0  Worry too much - different things 0  Trouble relaxing 1  Restless 1  Easily annoyed or irritable 0  Afraid - awful might happen 0  Total GAD 7 Score 3  Anxiety Difficulty Not difficult at all      Lab and Radiology Results Lab Results  Component Value Date   WBC 8.3 01/19/2017   HGB 17.8 (H) 01/19/2017   HCT 52.4 (H) 01/19/2017   MCV 96.0 01/19/2017   PLT 197 01/19/2017  Chemistry      Component Value Date/Time   NA 139 03/10/2017 0805   NA 132 (A) 10/04/2015   K 4.1 03/10/2017 0805   CL 104 03/10/2017 0805   CO2 29 03/10/2017 0805   BUN 19 03/10/2017 0805   BUN 8 10/04/2015   CREATININE 1.08 03/10/2017 0805   GLU  10/05/2015     Comment:     Enter to wrong chart      Component Value Date/Time   CALCIUM 8.6 03/10/2017 0805   ALKPHOS 56 01/19/2017 0819   AST 24 03/10/2017 0805   ALT 24 03/10/2017 0805   BILITOT 0.9 03/10/2017 0805     Lab Results  Component Value Date   CHOL 181 09/15/2016   HDL 48 09/15/2016   TRIG 96 09/15/2016   CHOLHDL 3.8 09/15/2016   Lab Results  Component Value Date    TESTOSTERONE 792 03/10/2017   Lab Results  Component Value Date   PSA 1.9 01/19/2017   PSA 1.5 09/15/2016      Assessment and Plan: 57 y.o. male with  Hypertension: Blood pressure elevated today.  Blood pressure is also not well controlled at home.  Plan to increase to losartan/hydrochloride 100/25.  Will transition to angiotensin receptor blocker due to cough due to ACE inhibitor.  Check metabolic panel today and recheck in clinic blood pressure and metabolic panel in about 3 months.  Testosterone: Clinically doing well.  Will check CBC metabolic panel and PSA.  Patient travels a great deal and has significant trouble getting to the lab during his mid cycle for testosterone check.  As his last check was okay I think is okay to defer testosterone levels at this visit.  Elevated hemoglobin: Likely due to poorly controlled sleep apnea.  I expect it is going to be better with recheck CBC today.  If not likely will decrease testosterone dose.  Sleep apnea: Doing quite well with CPAP continue current regimen.  Lipids: Again patient has difficulty getting to clinic for fasting lipids.  Plan to check direct LDL today to get a sense of how his  lipids are doing.   Anxiety: Anxiety trait versus disorder depending on symptom severity.  We had a lengthy discussion about options.  Plan for a trial of self-guided cognitive behavioral therapy.  He may benefit from telemedicine options due to his travel for CBT therapy in the future if needed.  Colon cancer screening [facility] ordered today.   Orders Placed This Encounter  Procedures  . Cologuard  . CBC  . COMPLETE METABOLIC PANEL WITH GFR  . PSA  . Direct LDL   Meds ordered this encounter  Medications  . NEEDLE, DISP, 18 G (BD HYPODERMIC NEEDLE) 18G X 1" MISC    Sig: USE AS DIRECTED TO ADMINISTER TESTOSTERONE.*NOT COVERED BY INSURANCE    Dispense:  100 each    Refill:  0  . SYRINGE-NEEDLE, DISP, 3 ML (B-D 3CC LUER-LOK SYR 21GX1-1/2) 21G X  1-1/2" 3 ML MISC    Sig: USE AS DIRECTED TO ADMINISTER TESTOSTERONE.    Dispense:  50 each    Refill:  0  . testosterone cypionate (DEPOTESTOSTERONE CYPIONATE) 200 MG/ML injection    Sig: INJECT 1ML INTO THE MUSCLE EVERY 14 DAYS    Dispense:  10 mL    Refill:  0  . losartan-hydrochlorothiazide (HYZAAR) 100-25 MG tablet    Sig: Take 1 tablet by mouth daily.    Dispense:  90 tablet    Refill:  1  Historical information moved to improve visibility of documentation.  Past Medical History:  Diagnosis Date  . Median nerve injury   . OSA (obstructive sleep apnea) 09/11/2016  . Ulnar nerve damage, right, initial encounter    Past Surgical History:  Procedure Laterality Date  . CARPAL TUNNEL RELEASE    . CARPAL TUNNEL WITH CUBITAL TUNNEL    . HERNIA REPAIR     Social History   Tobacco Use  . Smoking status: Never Smoker  . Smokeless tobacco: Never Used  Substance Use Topics  . Alcohol use: Yes    Alcohol/week: 3.0 - 6.0 oz    Types: 5 - 10 Cans of beer per week   family history includes Heart disease in his father; Hypertension in his father and mother.  Medications: Current Outpatient Medications  Medication Sig Dispense Refill  . AMBULATORY NON FORMULARY MEDICATION 5ml syringe, 21g 1.5 inch needles and 18 gauge blunt filler needles for testosterone injections every 2 weeks. Disp qs x 3 months. 1 each 0  . AMBULATORY NON FORMULARY MEDICATION Continuous positive airway pressure (CPAP) machine auto-titrate from 4-20 cm of H2O pressure, with all supplemental supplies as needed. 1 each 0  . NEEDLE, DISP, 18 G (BD HYPODERMIC NEEDLE) 18G X 1" MISC USE AS DIRECTED TO ADMINISTER TESTOSTERONE.*NOT COVERED BY INSURANCE 100 each 0  . sildenafil (REVATIO) 20 MG tablet Take 1 tablet (20 mg total) by mouth as needed. 50 tablet 11  . SYRINGE-NEEDLE, DISP, 3 ML (B-D 3CC LUER-LOK SYR 21GX1-1/2) 21G X 1-1/2" 3 ML MISC USE AS DIRECTED TO ADMINISTER TESTOSTERONE. 50 each 0  . testosterone  cypionate (DEPOTESTOSTERONE CYPIONATE) 200 MG/ML injection INJECT 1ML INTO THE MUSCLE EVERY 14 DAYS 10 mL 0  . losartan-hydrochlorothiazide (HYZAAR) 100-25 MG tablet Take 1 tablet by mouth daily. 90 tablet 1   No current facility-administered medications for this visit.    Allergies  Allergen Reactions  . Lisinopril Cough    Mild cough with lisinopril     Discussed warning signs or symptoms. Please see discharge instructions. Patient expresses understanding.

## 2017-12-24 LAB — CBC
HEMATOCRIT: 50.7 % — AB (ref 38.5–50.0)
HEMOGLOBIN: 17.8 g/dL — AB (ref 13.2–17.1)
MCH: 33.1 pg — ABNORMAL HIGH (ref 27.0–33.0)
MCHC: 35.1 g/dL (ref 32.0–36.0)
MCV: 94.2 fL (ref 80.0–100.0)
MPV: 10.7 fL (ref 7.5–12.5)
Platelets: 185 10*3/uL (ref 140–400)
RBC: 5.38 10*6/uL (ref 4.20–5.80)
RDW: 12.4 % (ref 11.0–15.0)
WBC: 10.1 10*3/uL (ref 3.8–10.8)

## 2017-12-24 LAB — COMPLETE METABOLIC PANEL WITH GFR
AG Ratio: 1.6 (calc) (ref 1.0–2.5)
ALBUMIN MSPROF: 4.3 g/dL (ref 3.6–5.1)
ALKALINE PHOSPHATASE (APISO): 53 U/L (ref 40–115)
ALT: 48 U/L — ABNORMAL HIGH (ref 9–46)
AST: 36 U/L — ABNORMAL HIGH (ref 10–35)
BUN: 20 mg/dL (ref 7–25)
CALCIUM: 9.5 mg/dL (ref 8.6–10.3)
CHLORIDE: 100 mmol/L (ref 98–110)
CO2: 29 mmol/L (ref 20–32)
Creat: 1.33 mg/dL (ref 0.70–1.33)
GFR, EST AFRICAN AMERICAN: 68 mL/min/{1.73_m2} (ref >=60)
GFR, Est Non African American: 59 mL/min/{1.73_m2} — ABNORMAL LOW (ref >=60)
Globulin: 2.7 g/dL (calc) (ref 1.9–3.7)
Glucose, Bld: 88 mg/dL (ref 65–99)
POTASSIUM: 4.3 mmol/L (ref 3.5–5.3)
SODIUM: 138 mmol/L (ref 135–146)
Total Bilirubin: 0.8 mg/dL (ref 0.2–1.2)
Total Protein: 7 g/dL (ref 6.1–8.1)

## 2017-12-24 LAB — PSA: PSA: 2 ng/mL (ref <=4.0)

## 2017-12-24 LAB — LDL CHOLESTEROL, DIRECT: Direct LDL: 164 mg/dL — ABNORMAL HIGH (ref <100)

## 2017-12-25 MED ORDER — ATORVASTATIN CALCIUM 20 MG PO TABS: 20.0000 mg | ORAL_TABLET | Freq: Every day | ORAL | 3 refills | Status: DC

## 2017-12-25 MED ORDER — TESTOSTERONE CYPIONATE 200 MG/ML IM SOLN: 150.0000 mg | INTRAMUSCULAR | 0 refills | Status: DC

## 2017-12-25 NOTE — Addendum Note (Signed)
Addended by: Gregor Hams on: 12/25/2017 07:53 AM   Modules accepted: Orders

## 2017-12-28 ENCOUNTER — Other Ambulatory Visit: Payer: Self-pay | Admitting: Family Medicine

## 2017-12-31 ENCOUNTER — Telehealth: Payer: Self-pay

## 2017-12-31 NOTE — Telephone Encounter (Signed)
Received fax from [facility] that additional information was needed on patient.  Form completed by Dr Georgina Snell and faxed. Copy of form along with confirmation sent to scan.

## 2018-03-11 ENCOUNTER — Encounter: Payer: Self-pay | Admitting: Family Medicine

## 2018-03-11 ENCOUNTER — Ambulatory Visit (INDEPENDENT_AMBULATORY_CARE_PROVIDER_SITE_OTHER): Payer: 59 | Admitting: Family Medicine

## 2018-03-11 VITALS — BP 164/97 | HR 100 | Ht 69.0 in | Wt 224.0 lb

## 2018-03-11 DIAGNOSIS — M4306 Spondylolysis, lumbar region: Secondary | ICD-10-CM | POA: Diagnosis not present

## 2018-03-11 DIAGNOSIS — M545 Low back pain, unspecified: Secondary | ICD-10-CM

## 2018-03-11 DIAGNOSIS — I10 Essential (primary) hypertension: Secondary | ICD-10-CM

## 2018-03-11 MED ORDER — HYDROCODONE-ACETAMINOPHEN 5-325 MG PO TABS: 1.0000 | ORAL_TABLET | Freq: Four times a day (QID) | ORAL | 0 refills | Status: DC | PRN

## 2018-03-11 MED ORDER — CYCLOBENZAPRINE HCL 10 MG PO TABS: 10.0000 mg | ORAL_TABLET | Freq: Three times a day (TID) | ORAL | 0 refills | Status: DC | PRN

## 2018-03-11 NOTE — Progress Notes (Signed)
Glen Chambers is a 57 y.o. male who presents to [facility]: [facility] today for left low back pain.  Neeko notes a one-week history of pain in the left low back.  This occurred without injury.  He notes some pain radiating down the lateral aspect of his thigh but not past the knee.  He notes pain is worse with activity and better with rest.  He denies any other radiating pain weakness or numbness bowel bladder dysfunction.  No fevers or chills nausea vomiting diarrhea.  He is tried ibuprofen and Aleve which do help some.  He has had pain similar to this in the past but denies any recent injury.  He had a significant accident in 2015 and had multiple CT scans.  At that time he was found to have a right L5 pars defect.   ROS as above:  Exam:  BP (!) 164/97   Pulse 100   Ht 5\' 9"  (1.753 m)   Wt 224 lb (101.6 kg)   BMI 33.08 kg/m  Wt Readings from Last 5 Encounters:  03/11/18 224 lb (101.6 kg)  12/23/17 222 lb (100.7 kg)  01/26/17 221 lb (100.2 kg)  10/28/16 210 lb (95.3 kg)  10/15/16 219 lb (99.3 kg)    Gen: Well NAD HEENT: EOMI,  MMM Lungs: Normal work of breathing. CTABL Heart: RRR no MRG Abd: NABS, Soft. Nondistended, Nontender Exts: Brisk capillary refill, warm and well perfused.  L-spine: Nontender to midline.  Tender palpation left SI joint region. Lumbar motion normal flexion rotation and lateral flexion pain and decreased motion with extension. Lower extremity strength reflexes and sensation are equal normal throughout. Hip normal-appearing normal motion nontender normal strength  Lab and Radiology Results CT scan abdomen and pelvis from 2015 reviewed showing right L5 pars defect   Assessment and Plan: 57 y.o. male with  Left low back pain: Likely myofascial strain.  Plan for physical therapy heating pad NSAIDs and muscle relaxers.  Work on home exercise  program.  If not improving recheck and will proceed with further work-up.  Return sooner if needed.  Blood pressure elevated today.  Patient has a follow-up appointment scheduled in the near future.  Blood pressure likely elevated due to pain and NSAID use.  Will reassess blood pressure as scheduled next month.   Orders Placed This Encounter  Procedures  . Ambulatory referral to Physical Therapy    Referral Priority:   Routine    Referral Type:   Physical Medicine    Referral Reason:   Specialty Services Required    Requested Specialty:   Physical Therapy   Meds ordered this encounter  Medications  . cyclobenzaprine (FLEXERIL) 10 MG tablet    Sig: Take 1 tablet (10 mg total) by mouth 3 (three) times daily as needed for muscle spasms.    Dispense:  30 tablet    Refill:  0  . HYDROcodone-acetaminophen (NORCO/VICODIN) 5-325 MG tablet    Sig: Take 1 tablet by mouth every 6 (six) hours as needed.    Dispense:  15 tablet    Refill:  0     Historical information moved to improve visibility of documentation.  Past Medical History:  Diagnosis Date  . Median nerve injury   . OSA (obstructive sleep apnea) 09/11/2016  . Ulnar nerve damage, right, initial encounter    Past Surgical History:  Procedure Laterality Date  . CARPAL TUNNEL RELEASE    . CARPAL TUNNEL  WITH CUBITAL TUNNEL    . HERNIA REPAIR     Social History   Tobacco Use  . Smoking status: Never Smoker  . Smokeless tobacco: Never Used  Substance Use Topics  . Alcohol use: Yes    Alcohol/week: 5.0 - 10.0 standard drinks    Types: 5 - 10 Cans of beer per week   family history includes Heart disease in his father; Hypertension in his father and mother.  Medications: Current Outpatient Medications  Medication Sig Dispense Refill  . AMBULATORY NON FORMULARY MEDICATION 70ml syringe, 21g 1.5 inch needles and 18 gauge blunt filler needles for testosterone injections every 2 weeks. Disp qs x 3 months. 1 each 0  . AMBULATORY  NON FORMULARY MEDICATION Continuous positive airway pressure (CPAP) machine auto-titrate from 4-20 cm of H2O pressure, with all supplemental supplies as needed. 1 each 0  . atorvastatin (LIPITOR) 20 MG tablet Take 1 tablet (20 mg total) by mouth daily. 90 tablet 3  . lisinopril (PRINIVIL,ZESTRIL) 10 MG tablet TAKE 1 TABLET (10 MG TOTAL) BY MOUTH DAILY. MUST KEEP FOLLOW UP APPOINTMENT 30 tablet 0  . losartan-hydrochlorothiazide (HYZAAR) 100-25 MG tablet Take 1 tablet by mouth daily. 90 tablet 1  . NEEDLE, DISP, 18 G (BD HYPODERMIC NEEDLE) 18G X 1" MISC USE AS DIRECTED TO ADMINISTER TESTOSTERONE.*NOT COVERED BY INSURANCE 100 each 0  . sildenafil (REVATIO) 20 MG tablet Take 1 tablet (20 mg total) by mouth as needed. 50 tablet 11  . SYRINGE-NEEDLE, DISP, 3 ML (B-D 3CC LUER-LOK SYR 21GX1-1/2) 21G X 1-1/2" 3 ML MISC USE AS DIRECTED TO ADMINISTER TESTOSTERONE. 50 each 0  . testosterone cypionate (DEPOTESTOSTERONE CYPIONATE) 200 MG/ML injection Inject 0.75 mLs (150 mg total) into the muscle every 14 (fourteen) days. 9 mL 0  . cyclobenzaprine (FLEXERIL) 10 MG tablet Take 1 tablet (10 mg total) by mouth 3 (three) times daily as needed for muscle spasms. 30 tablet 0  . HYDROcodone-acetaminophen (NORCO/VICODIN) 5-325 MG tablet Take 1 tablet by mouth every 6 (six) hours as needed. 15 tablet 0   No current facility-administered medications for this visit.    Allergies  Allergen Reactions  . Lisinopril Cough    Mild cough with lisinopril     Discussed warning signs or symptoms. Please see discharge instructions. Patient expresses understanding.

## 2018-03-11 NOTE — Patient Instructions (Signed)
Thank you for coming in today. Use norco sparingly.  Attend PT if not improving.  Use muscle relaxer at bedtime as needed.  Come back or go to the emergency room if you notice new weakness new numbness problems walking or bowel or bladder problems.   Lumbosacral Strain Lumbosacral strain is an injury that causes pain in the lower back (lumbosacral spine). This injury usually occurs from overstretching the muscles or ligaments along your spine. A strain can affect one or more muscles or cord-like tissues that connect bones to other bones (ligaments). What are the causes? This condition may be caused by:  A hard, direct hit (blow) to the back.  Excessive stretching of the lower back muscles. This may result from: ? A fall. ? Lifting something heavy. ? Repetitive movements such as bending or crouching.  What increases the risk? The following factors may increase your risk of getting this condition:  Participating in sports or activities that involve: ? A sudden twist of the back. ? Pushing or pulling motions.  Being overweight or obese.  Having poor strength and flexibility, especially tight hamstrings or weak muscles in the back or abdomen.  Having too much of a curve in the lower back.  Having a pelvis that is tilted forward.  What are the signs or symptoms? The main symptom of this condition is pain in the lower back, at the site of the strain. Pain may extend (radiate) down one or both legs. How is this diagnosed? This condition is diagnosed based on:  Your symptoms.  Your medical history.  A physical exam. ? Your health care provider may push on certain areas of your back to determine the source of your pain. ? You may be asked to bend forward, backward, and side to side to assess the severity of your pain and your range of motion.  Imaging tests, such as: ? X-rays. ? MRI.  How is this treated? Treatment for this condition may include:  Putting heat and cold on  the affected area.  Medicines to help relieve pain and relax your muscles (muscle relaxants).  NSAIDs to help reduce swelling and discomfort.  When your symptoms improve, it is important to gradually return to your normal routine as soon as possible to reduce pain, avoid stiffness, and avoid loss of muscle strength. Generally, symptoms should improve within 6 weeks of treatment. However, recovery time varies. Follow these instructions at home: Managing pain, stiffness, and swelling   If directed, put ice on the injured area during the first 24 hours after your strain. ? Put ice in a plastic bag. ? Place a towel between your skin and the bag. ? Leave the ice on for 20 minutes, 2-3 times a day.  If directed, put heat on the affected area as often as told by your health care provider. Use the heat source that your health care provider recommends, such as a moist heat pack or a heating pad. ? Place a towel between your skin and the heat source. ? Leave the heat on for 20-30 minutes. ? Remove the heat if your skin turns bright red. This is especially important if you are unable to feel pain, heat, or cold. You may have a greater risk of getting burned. Activity  Rest and return to your normal activities as told by your health care provider. Ask your health care provider what activities are safe for you.  Avoid activities that take a lot of energy for as long as told by  your health care provider. General instructions  Take over-the-counter and prescription medicines only as told by your health care provider.  Donot drive or use heavy machinery while taking prescription pain medicine.  Do not use any products that contain nicotine or tobacco, such as cigarettes and e-cigarettes. If you need help quitting, ask your health care provider.  Keep all follow-up visits as told by your health care provider. This is important. How is this prevented?  Use correct form when playing sports and  lifting heavy objects.  Use good posture when sitting and standing.  Maintain a healthy weight.  Sleep on a mattress with medium firmness to support your back.  Be safe and responsible while being active to avoid falls.  Do at least 150 minutes of moderate-intensity exercise each week, such as brisk walking or water aerobics. Try a form of exercise that takes stress off your back, such as swimming or stationary cycling.  Maintain physical fitness, including: ? Strength. ? Flexibility. ? Cardiovascular fitness. ? Endurance. Contact a health care provider if:  Your back pain does not improve after 6 weeks of treatment.  Your symptoms get worse. Get help right away if:  Your back pain is severe.  You cannot stand or walk.  You have difficulty controlling when you urinate or when you have a bowel movement.  You feel nauseous or you vomit.  Your feet get very cold.  You have numbness, tingling, weakness, or problems using your arms or legs.  You develop any of the following: ? Shortness of breath. ? Dizziness. ? Pain in your legs. ? Weakness in your buttocks or legs. ? Discoloration of the skin on your toes or legs. This information is not intended to replace advice given to you by your health care provider. Make sure you discuss any questions you have with your health care provider. Document Released: 03/12/2005 Document Revised: 12/21/2015 Document Reviewed: 11/04/2015 Elsevier Interactive Patient Education  [employer].

## 2018-03-18 ENCOUNTER — Ambulatory Visit: Payer: 59 | Admitting: Physical Therapy

## 2018-03-25 ENCOUNTER — Encounter: Payer: Self-pay | Admitting: Family Medicine

## 2018-03-25 ENCOUNTER — Ambulatory Visit (INDEPENDENT_AMBULATORY_CARE_PROVIDER_SITE_OTHER): Payer: 59 | Admitting: Family Medicine

## 2018-03-25 VITALS — BP 158/84 | HR 83 | Ht 69.0 in | Wt 221.0 lb

## 2018-03-25 DIAGNOSIS — D582 Other hemoglobinopathies: Secondary | ICD-10-CM | POA: Diagnosis not present

## 2018-03-25 DIAGNOSIS — N529 Male erectile dysfunction, unspecified: Secondary | ICD-10-CM

## 2018-03-25 DIAGNOSIS — G4733 Obstructive sleep apnea (adult) (pediatric): Secondary | ICD-10-CM | POA: Diagnosis not present

## 2018-03-25 DIAGNOSIS — R7989 Other specified abnormal findings of blood chemistry: Secondary | ICD-10-CM | POA: Diagnosis not present

## 2018-03-25 DIAGNOSIS — M4306 Spondylolysis, lumbar region: Secondary | ICD-10-CM

## 2018-03-25 DIAGNOSIS — E785 Hyperlipidemia, unspecified: Secondary | ICD-10-CM | POA: Insufficient documentation

## 2018-03-25 DIAGNOSIS — R6882 Decreased libido: Secondary | ICD-10-CM

## 2018-03-25 DIAGNOSIS — E782 Mixed hyperlipidemia: Secondary | ICD-10-CM

## 2018-03-25 DIAGNOSIS — M5416 Radiculopathy, lumbar region: Secondary | ICD-10-CM | POA: Insufficient documentation

## 2018-03-25 DIAGNOSIS — I10 Essential (primary) hypertension: Secondary | ICD-10-CM

## 2018-03-25 MED ORDER — TESTOSTERONE CYPIONATE 200 MG/ML IM SOLN: 150.0000 mg | INTRAMUSCULAR | 0 refills | Status: DC

## 2018-03-25 MED ORDER — AMBULATORY NON FORMULARY MEDICATION: 0 refills | Status: AC

## 2018-03-25 MED ORDER — TADALAFIL 20 MG PO TABS: 10.0000 mg | ORAL_TABLET | ORAL | 11 refills | Status: DC | PRN

## 2018-03-25 MED ORDER — AMLODIPINE BESYLATE 10 MG PO TABS: 10.0000 mg | ORAL_TABLET | Freq: Every day | ORAL | 1 refills | Status: DC

## 2018-03-25 NOTE — Patient Instructions (Addendum)
Thank you for coming in today. Attend PT for left leg.  Switch to Cialis at [facility].  Add Amlodipine for blood pressure.  Continue Losartan/HCTZ I will get lab results like Monday.  Recheck in 2 months or so for physical Return sooner if needed.

## 2018-03-25 NOTE — Progress Notes (Signed)
Glen Chambers is a 57 y.o. male who presents to [facility] today for hypertension, low testosterone, erectile dysfunction, left leg radicular pain, sleep apnea.  [name] has a history of low testosterone.  He is currently receiving testosterone supplementation.  He notes it works quite well and is satisfied with how things are going.  He notes improved libido and energy.  He notes that he continues to use Viagra for his mild erectile dysfunction.  In the past Cialis worked better.  He is interested in switching to Cialis now that is cheaper.  Additionally he notes hypertension.  He takes losartan/hydrochlorothiazide daily.  He checks his blood pressure at home and notes it is typically in the mid 130s to mid 80s.  He denies any blood pressures lower than that.  No chest pain palpitations lightheadedness or dizziness.  3 months ago he was found to have elevated LDL and was started on atorvastatin.  He takes atorvastatin daily and tolerates it well with no significant muscle aches or pains.  He has a history of sleep apnea and is using CPAP regularly.  He has 2 machines one for travel one for home.  He wonders if his settings are adjusted correctly.  He finds the settings to be comfortable.  He feels well rested in the morning.  Glen Chambers had an episode of low back pain a few weeks ago.  The pain has completely resolved however he does note some burning numb tingly pain in the left lateral calf.  He has a pertinent history for pars defect right L5 noted incidentally on an abdominal and pelvis CT scan performed in 2015.  He has been referred to physical therapy for his back pain but has not yet to be able to schedule an appointment.  His first appointment with physical therapy is in the next few days.  No weakness or bowel or bladder dysfunction.   ROS as above:  Exam:  BP (!)  158/84   Pulse 83   Ht 5\' 9"  (1.753 m)   Wt 221 lb (100.2 kg)   BMI 32.64 kg/m  Wt Readings from Last 5 Encounters:  03/25/18 221 lb (100.2 kg)  03/11/18 224 lb (101.6 kg)  12/23/17 222 lb (100.7 kg)  01/26/17 221 lb (100.2 kg)  10/28/16 210 lb (95.3 kg)    Gen: Well NAD HEENT: EOMI,  MMM Lungs: Normal work of breathing. CTABL Heart: RRR no MRG Abd: NABS, Soft. Nondistended, Nontender Exts: Brisk capillary refill, warm and well perfused.  Normal gait.  Lab and Radiology Results Lab Results  Component Value Date   CHOL 181 09/15/2016   HDL 48 09/15/2016   LDLDIRECT 164 (H) 12/23/2017   TRIG 96 09/15/2016   CHOLHDL 3.8 09/15/2016     Chemistry      Component Value Date/Time   NA 138 12/23/2017 1122   NA 132 (A) 10/04/2015   K 4.3 12/23/2017 1122   CL 100 12/23/2017 1122   CO2 29 12/23/2017 1122   BUN 20 12/23/2017 1122   BUN 8 10/04/2015   CREATININE 1.33 12/23/2017 1122   GLU  10/05/2015     Comment:     Enter to wrong chart      Component Value Date/Time   CALCIUM 9.5 12/23/2017 1122   ALKPHOS 56 01/19/2017 0819   AST 36 (H) 12/23/2017 1122   ALT 48 (H) 12/23/2017 1122   BILITOT 0.8 12/23/2017 1122  Lab Results  Component Value Date   WBC 10.1 12/23/2017   HGB 17.8 (H) 12/23/2017   HCT 50.7 (H) 12/23/2017   MCV 94.2 12/23/2017   PLT 185 12/23/2017   Lab Results  Component Value Date   TESTOSTERONE 792 03/10/2017      Assessment and Plan: 57 y.o. male with  Hypertension: Blood pressure not fully controlled.  Plan to add amlodipine and continue losartan/hydrochlorothiazide.  Check in about 2 months.  Hyperlipidemia: Tolerating atorvastatin check lipid panel in the near future  Testosterone: Doing well.  Check testosterone levels.  Recheck hemoglobin given history of elevated hemoglobin.  Left leg pain: Likely L5 radiculopathy.  Plan for physical therapy given pars defect history.  If not better next step would be MRI.  Sleep apnea:  Clinically doing well.  Check 30-day report for settings.  Erectile dysfunction switch to Cialis.   Orders Placed This Encounter  Procedures  . CBC  . COMPLETE METABOLIC PANEL WITH GFR  . Lipid Panel w/reflex Direct LDL  . Testosterone  . HM HIV SCREENING LAB    This external order was created through the Results Console.  Marland Kitchen HM HEPATITIS C SCREENING LAB    This external order was created through the Results Console.   Meds ordered this encounter  Medications  . amLODipine (NORVASC) 10 MG tablet    Sig: Take 1 tablet (10 mg total) by mouth daily.    Dispense:  90 tablet    Refill:  1  . AMBULATORY NON FORMULARY MEDICATION    Sig: Send 30 day CPAP report.  [facility]    Dispense:  1 each    Refill:  0  . tadalafil (ADCIRCA/CIALIS) 20 MG tablet    Sig: Take 0.5-1 tablets (10-20 mg total) by mouth every other day as needed for erectile dysfunction.    Dispense:  30 tablet    Refill:  11  . testosterone cypionate (DEPOTESTOSTERONE CYPIONATE) 200 MG/ML injection    Sig: Inject 0.75 mLs (150 mg total) into the muscle every 14 (fourteen) days.    Dispense:  15 mL    Refill:  0    Replaces 200mg  dose     Historical information moved to improve visibility of documentation.  Past Medical History:  Diagnosis Date  . Median nerve injury   . OSA (obstructive sleep apnea) 09/11/2016  . Ulnar nerve damage, right, initial encounter    Past Surgical History:  Procedure Laterality Date  . CARPAL TUNNEL RELEASE    . CARPAL TUNNEL WITH CUBITAL TUNNEL    . HERNIA REPAIR     Social History   Tobacco Use  . Smoking status: Never Smoker  . Smokeless tobacco: Never Used  Substance Use Topics  . Alcohol use: Yes    Alcohol/week: 5.0 - 10.0 standard drinks    Types: 5 - 10 Cans of beer per week   family history includes Heart disease in his father; Hypertension in his father and mother.  Medications: Current Outpatient Medications  Medication Sig Dispense Refill  . AMBULATORY NON  FORMULARY MEDICATION 58ml syringe, 21g 1.5 inch needles and 18 gauge blunt filler needles for testosterone injections every 2 weeks. Disp qs x 3 months. 1 each 0  . AMBULATORY NON FORMULARY MEDICATION Send 30 day CPAP report.  [facility] 1 each 0  . atorvastatin (LIPITOR) 20 MG tablet Take 1 tablet (20 mg total) by mouth daily. 90 tablet 3  . cyclobenzaprine (FLEXERIL) 10 MG tablet Take 1 tablet (10 mg total)  by mouth 3 (three) times daily as needed for muscle spasms. 30 tablet 0  . HYDROcodone-acetaminophen (NORCO/VICODIN) 5-325 MG tablet Take 1 tablet by mouth every 6 (six) hours as needed. 15 tablet 0  . losartan-hydrochlorothiazide (HYZAAR) 100-25 MG tablet Take 1 tablet by mouth daily. 90 tablet 1  . NEEDLE, DISP, 18 G (BD HYPODERMIC NEEDLE) 18G X 1" MISC USE AS DIRECTED TO ADMINISTER TESTOSTERONE.*NOT COVERED BY INSURANCE 100 each 0  . SYRINGE-NEEDLE, DISP, 3 ML (B-D 3CC LUER-LOK SYR 21GX1-1/2) 21G X 1-1/2" 3 ML MISC USE AS DIRECTED TO ADMINISTER TESTOSTERONE. 50 each 0  . testosterone cypionate (DEPOTESTOSTERONE CYPIONATE) 200 MG/ML injection Inject 0.75 mLs (150 mg total) into the muscle every 14 (fourteen) days. 15 mL 0  . amLODipine (NORVASC) 10 MG tablet Take 1 tablet (10 mg total) by mouth daily. 90 tablet 1  . tadalafil (ADCIRCA/CIALIS) 20 MG tablet Take 0.5-1 tablets (10-20 mg total) by mouth every other day as needed for erectile dysfunction. 30 tablet 11   No current facility-administered medications for this visit.    Allergies  Allergen Reactions  . Lisinopril Cough    Mild cough with lisinopril     Discussed warning signs or symptoms. Please see discharge instructions. Patient expresses understanding.

## 2018-03-26 DIAGNOSIS — I10 Essential (primary) hypertension: Secondary | ICD-10-CM | POA: Diagnosis not present

## 2018-03-26 DIAGNOSIS — G4733 Obstructive sleep apnea (adult) (pediatric): Secondary | ICD-10-CM | POA: Diagnosis not present

## 2018-03-26 DIAGNOSIS — R7989 Other specified abnormal findings of blood chemistry: Secondary | ICD-10-CM | POA: Diagnosis not present

## 2018-03-27 LAB — COMPLETE METABOLIC PANEL WITH GFR
AG Ratio: 1.8 (calc) (ref 1.0–2.5)
ALKALINE PHOSPHATASE (APISO): 47 U/L (ref 40–115)
ALT: 45 U/L (ref 9–46)
AST: 35 U/L (ref 10–35)
Albumin: 4.2 g/dL (ref 3.6–5.1)
BUN: 21 mg/dL (ref 7–25)
CO2: 31 mmol/L (ref 20–32)
CREATININE: 1.16 mg/dL (ref 0.70–1.33)
Calcium: 9.1 mg/dL (ref 8.6–10.3)
Chloride: 101 mmol/L (ref 98–110)
GFR, EST NON AFRICAN AMERICAN: 70 mL/min/{1.73_m2} (ref >=60)
GFR, Est African American: 81 mL/min/{1.73_m2} (ref >=60)
GLOBULIN: 2.3 g/dL (ref 1.9–3.7)
GLUCOSE: 81 mg/dL (ref 65–99)
Potassium: 4.2 mmol/L (ref 3.5–5.3)
SODIUM: 139 mmol/L (ref 135–146)
Total Bilirubin: 0.5 mg/dL (ref 0.2–1.2)
Total Protein: 6.5 g/dL (ref 6.1–8.1)

## 2018-03-27 LAB — LIPID PANEL W/REFLEX DIRECT LDL
CHOL/HDL RATIO: 3.2 (calc) (ref <5.0)
CHOLESTEROL: 111 mg/dL (ref <200)
HDL: 35 mg/dL — ABNORMAL LOW (ref >40)
LDL CHOLESTEROL (CALC): 59 mg/dL
NON-HDL CHOLESTEROL (CALC): 76 mg/dL (ref <130)
Triglycerides: 90 mg/dL (ref <150)

## 2018-03-27 LAB — CBC
HEMATOCRIT: 53.3 % — AB (ref 38.5–50.0)
Hemoglobin: 18.1 g/dL — ABNORMAL HIGH (ref 13.2–17.1)
MCH: 32.7 pg (ref 27.0–33.0)
MCHC: 34 g/dL (ref 32.0–36.0)
MCV: 96.2 fL (ref 80.0–100.0)
MPV: 10.3 fL (ref 7.5–12.5)
Platelets: 205 10*3/uL (ref 140–400)
RBC: 5.54 10*6/uL (ref 4.20–5.80)
RDW: 11.7 % (ref 11.0–15.0)
WBC: 9 10*3/uL (ref 3.8–10.8)

## 2018-03-27 LAB — TESTOSTERONE: Testosterone: 828 ng/dL — ABNORMAL HIGH (ref 250–827)

## 2018-05-01 DIAGNOSIS — J019 Acute sinusitis, unspecified: Secondary | ICD-10-CM | POA: Diagnosis not present

## 2018-05-01 DIAGNOSIS — R05 Cough: Secondary | ICD-10-CM | POA: Diagnosis not present

## 2018-05-25 ENCOUNTER — Ambulatory Visit (INDEPENDENT_AMBULATORY_CARE_PROVIDER_SITE_OTHER): Payer: 59 | Admitting: Family Medicine

## 2018-05-25 DIAGNOSIS — Z5329 Procedure and treatment not carried out because of patient's decision for other reasons: Secondary | ICD-10-CM

## 2018-06-13 ENCOUNTER — Other Ambulatory Visit: Payer: Self-pay | Admitting: Family Medicine

## 2018-06-25 NOTE — Progress Notes (Signed)
No show

## 2018-07-02 ENCOUNTER — Encounter: Payer: Self-pay | Admitting: Family Medicine

## 2018-07-02 ENCOUNTER — Ambulatory Visit (INDEPENDENT_AMBULATORY_CARE_PROVIDER_SITE_OTHER): Payer: 59 | Admitting: Family Medicine

## 2018-07-02 VITALS — BP 149/83 | HR 93 | Wt 227.0 lb

## 2018-07-02 DIAGNOSIS — G5622 Lesion of ulnar nerve, left upper limb: Secondary | ICD-10-CM | POA: Diagnosis not present

## 2018-07-02 DIAGNOSIS — I10 Essential (primary) hypertension: Secondary | ICD-10-CM | POA: Diagnosis not present

## 2018-07-02 NOTE — Progress Notes (Signed)
Glen Chambers is a 58 y.o. male who presents to [place]: [place] today for left elbow pain.  Glen Chambers was in his normal state of health about a week ago.  He was at a conference and had his left arm perched on it in a shelf with pressure at his medial elbow.  He had some soreness in the medial elbow during the event however since then he has had moderate persistent pain in the medial elbow.  However he additionally has numbness and tingling radiating to his ulnar hand.  Additionally he is having some weakness with finger abduction and adduction.  He notes the numbness is slightly improving with decreased pressure.  He has been careful to avoid further pressure on his medial elbow.  Additionally he restarted using a night splint to avoid prolonged elbow flexion at that time.  He thinks this is helped a bit as well.  He has a pertinent orthopedic history for bilateral cubital tunnel and carpal tunnel syndrome.  He has had carpal tunnel release and cubital tunnel release bilaterally about 9 years ago.  He notes his left elbow was released but is not sure if the nerve was transposed about 9 years ago.  He was asymptomatic until recently.  He denies any further injury fevers or chills.  Is not tried much medications yet aside from some over-the-counter medications which helped a little.  Patient notes that he called his hand surgeon (Dr. Bertis Ruddy office who performed the ulnar nerve surgery years ago.  He scheduled an appointment Thursday of next week.   Additionally Glen Chambers has chronic medical problems consisting of low testosterone, hypertension, elevated hemoglobin. He notes his blood pressure is typically quite well controlled in the 120s over 70s.  He notes that he has been using his CPAP for sleep apnea which has been very effective.  He takes medications listed below and is pretty happy  with how things are going in general.  ROS as above:  Exam:  BP (!) 149/83   Pulse 93   Wt 227 lb (103 kg)   BMI 33.52 kg/m  Wt Readings from Last 5 Encounters:  07/02/18 227 lb (103 kg)  03/25/18 221 lb (100.2 kg)  03/11/18 224 lb (101.6 kg)  12/23/17 222 lb (100.7 kg)  01/26/17 221 lb (100.2 kg)    Gen: Well NAD HEENT: EOMI,  MMM Lungs: Normal work of breathing. CTABL Heart: RRR no MRG Abd: NABS, Soft. Nondistended, Nontender Exts: Brisk capillary refill, warm and well perfused.  Left elbow relatively normal-appearing without significant swelling or erythema.  Mature scar at medial elbow overlying the cubital tunnel. Elbow range of motion is normal. Positive Tinel's overlying medial epicondyle and around cubital tunnel. Hand paresthesia is worse with elbow flexion.  Left hand and wrist relatively normal-appearing with the exception of slight flexion at PIP fifth digit.  Nontender normal motion. Strength is reduced to finger abduction 3/5 and adduction 3/5. Sensation decreased fifth digit and ulnar side of fourth digit. Pulses intact at wrist.  Lab and Radiology Results Limited musculoskeletal ultrasound left medial elbow. Ulnar nerve visible overlying bony prominence of medial epicondyle outside of the cubital tunnel.  Ulnar nerve diameter is enlarged with hypoechoic fluid surrounding the ulnar nerve.  With elbow flexion dynamically the ulnar nerve rolls over the bony prominence of the medial epicondyle.  Symptoms are worse with pressure with ultrasound probe overlying the nerve. The nerve fascicles appear to be intact on  longitudinal scanning.  Procedure: Real-time Ultrasound Guided ulnar nerve hydrodissection medial epicondyle/cubital tunnel Device: GE Logiq E   Images permanently stored and available for review in the ultrasound unit. Verbal informed consent obtained.  Discussed risks and benefits of procedure. Warned about infection bleeding damage to structures skin  hypopigmentation and fat atrophy among others. Patient expresses understanding and agreement Time-out conducted.   Noted no overlying erythema, induration, or other signs of local infection.   Skin prepped in a sterile fashion.   Local anesthesia: Topical Ethyl chloride.   With sterile technique and under real time ultrasound guidance:  40 mg of Kenalog and 1.5 mL of Marcaine injected easily.   Completed without difficulty   Pain immediately resolved suggesting accurate placement of the medication.   Advised to call if fevers/chills, erythema, induration, drainage, or persistent bleeding.   Images permanently stored and available for review in the ultrasound unit.  Impression: Technically successful ultrasound guided injection.     Assessment and Plan: 58 y.o. 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  Patient has weakness and numbness distally in the distribution of the ulnar nerve.  He is regaining some sensation which is reassuring.  However he does have significant weakness and I am concerned for worse injury in the future potentially.  I think is reasonable to keep his appointment with his hand surgeon.  Additionally I proceeded with ulnar nerve hydrodissection/injection today.  Plan to continue with extension splinting at night and recommend padding during the day.  Recheck in the near future with me.  Additionally blood pressure is a bit elevated today.  Likely due to pain.  Plan to recheck in about a month.  Patient due for follow-up chronic medical problems.  PDMP not reviewed this encounter. No orders of the defined types were placed in this encounter.  No orders of the defined types were placed in this encounter.    Historical information moved to improve visibility of documentation.  Past Medical History:  Diagnosis Date  . Median nerve injury   . OSA (obstructive sleep apnea) 09/11/2016  . Ulnar nerve damage, right, initial encounter    Past Surgical History:   Procedure Laterality Date  . CARPAL TUNNEL RELEASE    . CARPAL TUNNEL WITH CUBITAL TUNNEL    . HERNIA REPAIR     Social History   Tobacco Use  . Smoking status: Never Smoker  . Smokeless tobacco: Never Used  Substance Use Topics  . Alcohol use: Yes    Alcohol/week: 5.0 - 10.0 standard drinks    Types: 5 - 10 Cans of beer per week   family history includes Heart disease in his father; Hypertension in his father and mother.  Medications: Current Outpatient Medications  Medication Sig Dispense Refill  . AMBULATORY NON FORMULARY MEDICATION 54ml syringe, 21g 1.5 inch needles and 18 gauge blunt filler needles for testosterone injections every 2 weeks. Disp qs x 3 months. 1 each 0  . AMBULATORY NON FORMULARY MEDICATION Send 30 day CPAP report.  [facility] 1 each 0  . amLODipine (NORVASC) 10 MG tablet Take 1 tablet (10 mg total) by mouth daily. 90 tablet 1  . atorvastatin (LIPITOR) 20 MG tablet Take 1 tablet (20 mg total) by mouth daily. 90 tablet 3  . cyclobenzaprine (FLEXERIL) 10 MG tablet Take 1 tablet (10 mg total) by mouth 3 (three) times daily as needed for muscle spasms. 30 tablet 0  . hydrochlorothiazide (HYDRODIURIL) 25 MG tablet TAKE 1 TABLET BY MOUTH DAILY  90 tablet 0  . HYDROcodone-acetaminophen (NORCO/VICODIN) 5-325 MG tablet Take 1 tablet by mouth every 6 (six) hours as needed. 15 tablet 0  . losartan (COZAAR) 100 MG tablet TAKE 1 TABLET BY MOUTH DAILY 90 tablet 0  . NEEDLE, DISP, 18 G (BD HYPODERMIC NEEDLE) 18G X 1" MISC USE AS DIRECTED TO ADMINISTER TESTOSTERONE.*NOT COVERED BY INSURANCE 100 each 0  . SYRINGE-NEEDLE, DISP, 3 ML (B-D 3CC LUER-LOK SYR 21GX1-1/2) 21G X 1-1/2" 3 ML MISC USE AS DIRECTED TO ADMINISTER TESTOSTERONE. 50 each 0  . tadalafil (ADCIRCA/CIALIS) 20 MG tablet Take 0.5-1 tablets (10-20 mg total) by mouth every other day as needed for erectile dysfunction. 30 tablet 11  . testosterone cypionate (DEPOTESTOSTERONE CYPIONATE) 200 MG/ML injection Inject 0.75 mLs (150  mg total) into the muscle every 14 (fourteen) days. 15 mL 0   No current facility-administered medications for this visit.    Allergies  Allergen Reactions  . Lisinopril Cough    Mild cough with lisinopril     Discussed warning signs or symptoms. Please see discharge instructions. Patient expresses understanding.

## 2018-07-02 NOTE — Patient Instructions (Addendum)
Thank you for coming in today. Continue the splint/.brace to keep the elbow straight especially at night. I also recommend a soft elbow pad  I do recommend keeping the appointment with Dr Burney Gauze.  Recheck with me in 4 weeks or sooner if needed.  We will need sooner follow up especially if the weakness is not improving.    Cubital Tunnel Syndrome  Cubital tunnel syndrome is a condition that causes pain and weakness of the forearm and hand. It happens when one of the nerves that runs along the inside of the elbow joint (ulnar nerve) becomes irritated. This condition is usually caused by repeated arm motions that are done during sports or work-related activities. What are the causes? This condition may be caused by:  Increased pressure on the ulnar nerve at the elbow, arm, or forearm. This can result from: ? Irritation caused by repeated elbow bending. ? Poorly healed elbow fractures. ? Tumors in the elbow. These are usually noncancerous (benign). ? Scar tissue that develops in the elbow after an injury. ? Bony growths (spurs) near the ulnar nerve.  Stretching of the nerve due to loose elbow ligaments.  Trauma to the nerve at the elbow. What increases the risk? The following factors may make you more likely to develop this condition:  Doing manual labor that requires frequent bending of the elbow.  Playing sports that include repeated or strenuous throwing motions, such as baseball.  Playing contact sports, such as football or lacrosse.  Not warming up properly before activities.  Having diabetes.  Having an underactive thyroid (hypothyroidism). What are the signs or symptoms? Symptoms of this condition include:  Clumsiness or weakness of the hand.  Tenderness of the inner elbow.  Aching or soreness of the inner elbow, forearm, or fingers, especially the little finger or the ring finger.  Increased pain when forcing the elbow to bend.  Reduced control when throwing  objects.  Tingling, numbness, or a burning feeling inside the forearm or in part of the hand or fingers, especially the little finger or the ring finger.  Sharp pains that shoot from the elbow down to the wrist and hand.  The inability to grip or pinch hard. How is this diagnosed? This condition is diagnosed based on:  Your symptoms and medical history. Your health care provider will also ask for details about any injury.  A physical exam. You may also have tests, including:  Electromyogram (EMG). This test measures electrical signals sent by your nerves into the muscles.  Nerve conduction study. This test measures how well electrical signals pass through your nerves.  Imaging tests, such as X-rays, ultrasound, and MRI. These tests check for possible causes of your condition. How is this treated? This condition may be treated by:  Stopping the activities that are causing your symptoms to get worse.  Icing and taking medicines to reduce pain and swelling.  Wearing a splint to prevent your elbow from bending, or wearing an elbow pad where the ulnar nerve is closest to the skin.  Working with a physical therapist in less severe cases. This may help to: ? Decrease your symptoms. ? Improve the strength and range of motion of your elbow, forearm, and hand. If these treatments do not help, surgery may be needed. Follow these instructions at home: If you have a splint:  Wear the splint as told by your health care provider. Remove it only as told by your health care provider.  Loosen the splint if your fingers tingle,  become numb, or turn cold and blue.  Keep the splint clean.  If the splint is not waterproof: ? Do not let it get wet. ? Cover it with a watertight covering when you take a bath or shower. Managing pain, stiffness, and swelling   If directed, put ice on the injured area: ? Put ice in a plastic bag. ? Place a towel between your skin and the bag. ? Leave the ice  on for 20 minutes, 2-3 times a day.  Move your fingers often to avoid stiffness and to lessen swelling.  Raise (elevate) the injured area above the level of your heart while you are sitting or lying down. General instructions  Take over-the-counter and prescription medicines only as told by your health care provider.  Do any exercise or physical therapy as told by your health care provider.  Do not drive or use heavy machinery while taking prescription pain medicine.  If you were given an elbow pad, wear it as told by your health care provider.  Keep all follow-up visits as told by your health care provider. This is important. Contact a health care provider if:  Your symptoms get worse.  Your symptoms do not get better with treatment.  You have new pain.  Your hand on the injured side feels numb or cold. Summary  Cubital tunnel syndrome is a condition that causes pain and weakness of the forearm and hand.  You are more likely to develop this condition if you do work or play sports that involve repeated arm movements.  This condition is often treated by stopping repetitive activities, applying ice, and using anti-inflammatory medicines.  In rare cases, surgery may be needed. This information is not intended to replace advice given to you by your health care provider. Make sure you discuss any questions you have with your health care provider. Document Released: 06/02/2005 Document Revised: 10/19/2017 Document Reviewed: 10/19/2017 Elsevier Interactive Patient Education  2019 [employer].

## 2018-07-08 DIAGNOSIS — G5622 Lesion of ulnar nerve, left upper limb: Secondary | ICD-10-CM | POA: Diagnosis not present

## 2018-07-08 DIAGNOSIS — G5603 Carpal tunnel syndrome, bilateral upper limbs: Secondary | ICD-10-CM | POA: Diagnosis not present

## 2018-07-09 ENCOUNTER — Other Ambulatory Visit: Payer: Self-pay | Admitting: *Deleted

## 2018-07-09 DIAGNOSIS — G5603 Carpal tunnel syndrome, bilateral upper limbs: Secondary | ICD-10-CM

## 2018-07-12 ENCOUNTER — Other Ambulatory Visit: Payer: Self-pay | Admitting: *Deleted

## 2018-07-12 ENCOUNTER — Encounter: Payer: Self-pay | Admitting: Neurology

## 2018-07-12 DIAGNOSIS — G5603 Carpal tunnel syndrome, bilateral upper limbs: Secondary | ICD-10-CM

## 2018-07-20 ENCOUNTER — Encounter

## 2018-07-20 ENCOUNTER — Ambulatory Visit (INDEPENDENT_AMBULATORY_CARE_PROVIDER_SITE_OTHER): Payer: 59 | Admitting: Neurology

## 2018-07-20 DIAGNOSIS — G5621 Lesion of ulnar nerve, right upper limb: Secondary | ICD-10-CM

## 2018-07-20 DIAGNOSIS — G5603 Carpal tunnel syndrome, bilateral upper limbs: Secondary | ICD-10-CM

## 2018-07-20 DIAGNOSIS — G5622 Lesion of ulnar nerve, left upper limb: Secondary | ICD-10-CM

## 2018-07-20 NOTE — Procedures (Signed)
[facility]  [address]  [place], [place] [address] Tel: [phone] Fax:  [phone] Test Date:  07/20/2018  Patient: Glen Chambers DOB: 1960/12/03 Physician: Narda Amber, DO  Sex: Male Height: 5\' 9"  Ref Phys: Charlotte Crumb, MD  ID#: [id number] Temp: 34.0C Technician:    Patient Complaints: This is a 58 year old man with history of bilateral carpal tunnel release and cubital tunnel release referred for evaluation of acute worsening of left hand numbness and tingling and weakness.  NCV & EMG Findings: Extensive electrodiagnostic testing of the left upper extremity and additional studies of the right shows:  1. Bilateral ulnar and dorsal ulnar cutaneous sensory responses are absent.  Left median sensory response shows prolonged latency (4.2 ms) and normal amplitude.  Right median sensory response shows prolonged latency (4.6 ms) and reduced amplitude (11.0 V).   2. Left ulnar motor response is essentially absent.  Right ulnar motor response shows markedly reduced amplitude (2.1 mV) and slowed conduction velocity across the forearm (B Elbow-Wrist, 32 m/s).  Left median motor response shows prolonged latency (4.5 ms) and reduced amplitude (5.6 mV).  Right median motor response shows prolonged latency (4.8 ms) and normal amplitude. 3. Severe active on chronic motor axonal loss changes are seen affecting the left ulnar innervated muscles, with no motor unit recruitment in the left abductor digit he minimi.  Additionally, chronic motor axonal loss changes are seen affecting the abductor pollicis brevis, pronator teres, and triceps muscles on the left side only.  On the right side, chronic motor axonal loss changes are seen in the ulnar innervated muscle, without accompanied active denervation.  Impression: 1. Very severe, subacute left ulnar axonal neuropathy which is above the takeoff to the flexor carpi ulnaris muscle. 2. Chronic C7 radiculopathy affecting the left  upper extremity, moderate in degree electrically. 3. There is evidence of the residuals of a previously treated bilateral carpal tunnel syndrome (moderate) and right cubital tunnel syndrome (severe).     ___________________________ Narda Amber, DO    Nerve Conduction Studies Anti Sensory Summary Table   Site NR Peak (ms) Norm Peak (ms) P-T Amp (V) Norm P-T Amp  Left DorsCutan Anti Sensory (Dorsum 5th MC)  34C  Wrist NR  <3.1  >10  Right DorsCutan Anti Sensory (Dorsum 5th MC)  34C  Wrist NR  <3.1  >10  Left Median Anti Sensory (2nd Digit)  34C  Wrist    4.2 <3.6 16.0 >15  Right Median Anti Sensory (2nd Digit)  34C  Wrist    4.6 <3.6 11.0 >15  Left Ulnar Anti Sensory (5th Digit)  34C  Wrist NR  <3.1  >10  Right Ulnar Anti Sensory (5th Digit)  34C  Wrist NR  <3.1  >10   Motor Summary Table   Site NR Onset (ms) Norm Onset (ms) O-P Amp (mV) Norm O-P Amp Site1 Site2 Delta-0 (ms) Dist (cm) Vel (m/s) Norm Vel (m/s)  Left Median Motor (Abd Poll Brev)  34C  Wrist    4.5 <4.0 5.6 >6 Elbow Wrist 4.6 31.0 67 >50  Elbow    9.1  5.0         Right Median Motor (Abd Poll Brev)  34C  Wrist    4.8 <4.0 9.3 >6 Elbow Wrist 5.6 30.0 54 >50  Elbow    10.4  8.4         Left Ulnar Motor (Abd Dig Minimi)  34C  Wrist NR  <3.1  >7 B Elbow Wrist  24.0  >50  B Elbow NR     A Elbow B Elbow  10.0  >50  A Elbow NR            Right Ulnar Motor (Abd Dig Minimi)  34C  Wrist    2.4 <3.1 2.1 >7 B Elbow Wrist 7.4 24.0 32 >50  B Elbow    9.8  1.2  A Elbow B Elbow 0.9 10.0 111 >50  A Elbow    10.7  1.2         Left Ulnar (FDI) Motor (1st DI)  34C  Wrist    6.2 <4.5 0.2 >7 B Elbow Wrist  0.0  >50  B Elbow NR     A Elbow B Elbow  0.0  >50  A Elbow NR            Right Ulnar (FDI) Motor (1st DI)  34C  Wrist    3.6 <4.5 0.7 >7         EMG   Side Muscle Ins Act Fibs Psw Fasc Number Recrt Dur Dur. Amp Amp. Poly Poly. Comment  Left ABD Dig Min Nml 1+ Nml Nml NE None - - - - - - ATR  Left  PronatorTeres Nml Nml Nml Nml 2- Rapid Many 1+ Many 2+ Many 1+ N/A  Left Triceps Nml Nml Nml Nml 3- Rapid Many 1+ Many 2+ Many 2+ N/A  Left FlexCarpiUln Nml 1+ Nml Nml 2- Rapid Some 1+ Some 1+ Nml Nml N/A  Left 1stDorInt Nml 1+ Nml Nml SMU Rapid All 1+ All 1+ Nml Nml ATR  Left Abd Poll Brev Nml Nml Nml Nml 1- Rapid Some 1+ Some 1+ Nml Nml N/A  Left Deltoid Nml Nml Nml Nml Nml Nml Nml Nml Nml Nml Nml Nml N/A  Left Biceps Nml Nml Nml Nml Nml Nml Nml Nml Nml Nml Nml Nml N/A  Right FlexCarpiUln Nml Nml Nml Nml 2- Rapid Some 1+ Some 1+ Nml Nml N/A  Right 1stDorInt Nml Nml Nml Nml SMU Rapid All 1+ All 1+ Nml Nml ATR  Right PronatorTeres Nml Nml Nml Nml Nml Nml Nml Nml Nml Nml Nml Nml N/A  Right Triceps Nml Nml Nml Nml Nml Nml Nml Nml Nml Nml Nml Nml N/A  Right Deltoid Nml Nml Nml Nml Nml Nml Nml Nml Nml Nml Nml Nml N/A  Right Biceps Nml Nml Nml Nml Nml Nml Nml Nml Nml Nml Nml Nml N/A  Right Abd Poll Brev Nml Nml Nml Nml Nml Nml Nml Nml Nml Nml Nml Nml N/A      Waveforms:

## 2018-07-26 DIAGNOSIS — G5622 Lesion of ulnar nerve, left upper limb: Secondary | ICD-10-CM | POA: Diagnosis not present

## 2018-07-26 DIAGNOSIS — G5603 Carpal tunnel syndrome, bilateral upper limbs: Secondary | ICD-10-CM | POA: Diagnosis not present

## 2018-07-27 ENCOUNTER — Encounter: Payer: Self-pay | Admitting: Family Medicine

## 2018-07-30 ENCOUNTER — Encounter: Payer: Self-pay | Admitting: Family Medicine

## 2018-07-30 ENCOUNTER — Ambulatory Visit (INDEPENDENT_AMBULATORY_CARE_PROVIDER_SITE_OTHER): Payer: 59 | Admitting: Family Medicine

## 2018-07-30 VITALS — BP 141/77 | HR 73 | Ht 69.0 in | Wt 224.0 lb

## 2018-07-30 DIAGNOSIS — R7989 Other specified abnormal findings of blood chemistry: Secondary | ICD-10-CM | POA: Diagnosis not present

## 2018-07-30 DIAGNOSIS — I10 Essential (primary) hypertension: Secondary | ICD-10-CM | POA: Diagnosis not present

## 2018-07-30 DIAGNOSIS — D582 Other hemoglobinopathies: Secondary | ICD-10-CM

## 2018-07-30 DIAGNOSIS — Z5181 Encounter for therapeutic drug level monitoring: Secondary | ICD-10-CM

## 2018-07-30 MED ORDER — CANDESARTAN CILEXETIL-HCTZ 32-25 MG PO TABS: 1.0000 | ORAL_TABLET | Freq: Every day | ORAL | 1 refills | Status: DC

## 2018-07-30 NOTE — Progress Notes (Signed)
Glen Chambers is a 58 y.o. male who presents to [place]: [place] today for follow-up left ulnar neuritis as well as hypertension.  Glen Chambers was seen about a month ago on January 17 for left ulnar neuritis at cubital tunnel.  He subsequently had further evaluation with his hand surgeon and subsequent nerve conduction study and is now scheduled for ulnar nerve transposition on February 18.    Glen Chambers also has hypertension.  He takes amlodipine, losartan, hydrochlorothiazide.  He notes his blood pressures typically in the high 502D to 741O systolic range most of the time he checks it.  He denies chest pain palpitations shortness of breath lightheadedness or dizziness.    ROS as above:  Exam:  BP (!) 141/77   Pulse 73   Ht 5\' 9"  (1.753 m)   Wt 224 lb (101.6 kg)   BMI 33.08 kg/m  Wt Readings from Last 5 Encounters:  07/30/18 224 lb (101.6 kg)  07/02/18 227 lb (103 kg)  03/25/18 221 lb (100.2 kg)  03/11/18 224 lb (101.6 kg)  12/23/17 222 lb (100.7 kg)    Gen: Well NAD HEENT: EOMI,  MMM Lungs: Normal work of breathing. CTABL Heart: RRR no MRG Abd: NABS, Soft. Nondistended, Nontender Exts: Brisk capillary refill, warm and well perfused.    Lab and Radiology Results   Chemistry      Component Value Date/Time   NA 139 03/26/2018 0814   NA 132 (A) 10/04/2015   K 4.2 03/26/2018 0814   CL 101 03/26/2018 0814   CO2 31 03/26/2018 0814   BUN 21 03/26/2018 0814   BUN 8 10/04/2015   CREATININE 1.16 03/26/2018 0814   GLU  10/05/2015     Comment:     Enter to wrong chart      Component Value Date/Time   CALCIUM 9.1 03/26/2018 0814   ALKPHOS 56 01/19/2017 0819   AST 35 03/26/2018 0814   ALT 45 03/26/2018 0814   BILITOT 0.5 03/26/2018 0814        Assessment and Plan: 58 y.o. male with  Hypertension: Not out-of-control but not well managed.  Patient has maximum  dose of amlodipine hydrochlorothiazide and losartan.  Plan to switch to a more potent angiotensin receptor blocker.  Will use combo candesartan and hydrochlorothiazide.  We will switch after his surgery next week.  Additionally will plan on checking basic fasting labs to follow-up his testosterone metabolic panel etc. in about 2 months.  Check back with me in 6 months if all is well.    PDMP not reviewed this encounter. Orders Placed This Encounter  Procedures  . CBC  . COMPLETE METABOLIC PANEL WITH GFR  . Testosterone  . PSA   Meds ordered this encounter  Medications  . Candesartan Cilexetil-HCTZ 32-25 MG TABS    Sig: Take 1 tablet by mouth daily.    Dispense:  90 each    Refill:  1    Replaces losartan and HCTZ     Historical information moved to improve visibility of documentation.  Past Medical History:  Diagnosis Date  . Median nerve injury   . OSA (obstructive sleep apnea) 09/11/2016  . Ulnar nerve damage, right, initial encounter    Past Surgical History:  Procedure Laterality Date  . CARPAL TUNNEL RELEASE    . CARPAL TUNNEL WITH CUBITAL TUNNEL    . HERNIA REPAIR     Social History   Tobacco Use  . Smoking  status: Never Smoker  . Smokeless tobacco: Never Used  Substance Use Topics  . Alcohol use: Yes    Alcohol/week: 5.0 - 10.0 standard drinks    Types: 5 - 10 Cans of beer per week   family history includes Heart disease in his father; Hypertension in his father and mother.  Medications: Current Outpatient Medications  Medication Sig Dispense Refill  . AMBULATORY NON FORMULARY MEDICATION 8ml syringe, 21g 1.5 inch needles and 18 gauge blunt filler needles for testosterone injections every 2 weeks. Disp qs x 3 months. 1 each 0  . AMBULATORY NON FORMULARY MEDICATION Send 30 day CPAP report.  [facility] 1 each 0  . amLODipine (NORVASC) 10 MG tablet Take 1 tablet (10 mg total) by mouth daily. 90 tablet 1  . atorvastatin (LIPITOR) 20 MG tablet Take 1 tablet (20 mg  total) by mouth daily. 90 tablet 3  . HYDROcodone-acetaminophen (NORCO/VICODIN) 5-325 MG tablet Take 1 tablet by mouth every 6 (six) hours as needed. 15 tablet 0  . NEEDLE, DISP, 18 G (BD HYPODERMIC NEEDLE) 18G X 1" MISC USE AS DIRECTED TO ADMINISTER TESTOSTERONE.*NOT COVERED BY INSURANCE 100 each 0  . SYRINGE-NEEDLE, DISP, 3 ML (B-D 3CC LUER-LOK SYR 21GX1-1/2) 21G X 1-1/2" 3 ML MISC USE AS DIRECTED TO ADMINISTER TESTOSTERONE. 50 each 0  . tadalafil (ADCIRCA/CIALIS) 20 MG tablet Take 0.5-1 tablets (10-20 mg total) by mouth every other day as needed for erectile dysfunction. 30 tablet 11  . testosterone cypionate (DEPOTESTOSTERONE CYPIONATE) 200 MG/ML injection Inject 0.75 mLs (150 mg total) into the muscle every 14 (fourteen) days. 15 mL 0  . Candesartan Cilexetil-HCTZ 32-25 MG TABS Take 1 tablet by mouth daily. 90 each 1   No current facility-administered medications for this visit.    Allergies  Allergen Reactions  . Lisinopril Cough    Mild cough with lisinopril     Discussed warning signs or symptoms. Please see discharge instructions. Patient expresses understanding.

## 2018-07-30 NOTE — Patient Instructions (Addendum)
Thank you for coming in today. Next week after surgery perhaps Saturday switch from HCTZ and Losartan to combo Candesartan/HCTZ.  Insurance or pharmacy may want a different "sartan" like Valsartan or Ibersartan   Get labs in April between injections.  Get them fasting in the morning.   Recheck in the summer if all is well.

## 2018-08-04 DIAGNOSIS — G5622 Lesion of ulnar nerve, left upper limb: Secondary | ICD-10-CM | POA: Diagnosis not present

## 2018-08-04 DIAGNOSIS — M898X8 Other specified disorders of bone, other site: Secondary | ICD-10-CM | POA: Diagnosis not present

## 2018-08-04 DIAGNOSIS — D2112 Benign neoplasm of connective and other soft tissue of left upper limb, including shoulder: Secondary | ICD-10-CM | POA: Diagnosis not present

## 2018-08-04 DIAGNOSIS — D481 Neoplasm of uncertain behavior of connective and other soft tissue: Secondary | ICD-10-CM | POA: Diagnosis not present

## 2018-08-05 DIAGNOSIS — G5622 Lesion of ulnar nerve, left upper limb: Secondary | ICD-10-CM | POA: Diagnosis not present

## 2018-08-06 ENCOUNTER — Encounter: Payer: Self-pay | Admitting: Family Medicine

## 2018-08-11 ENCOUNTER — Encounter: Payer: Self-pay | Admitting: Family Medicine

## 2018-08-12 DIAGNOSIS — G5622 Lesion of ulnar nerve, left upper limb: Secondary | ICD-10-CM | POA: Diagnosis not present

## 2018-08-12 DIAGNOSIS — M25522 Pain in left elbow: Secondary | ICD-10-CM | POA: Diagnosis not present

## 2018-09-05 ENCOUNTER — Other Ambulatory Visit: Payer: Self-pay | Admitting: Family Medicine

## 2018-09-08 ENCOUNTER — Other Ambulatory Visit: Payer: Self-pay | Admitting: Family Medicine

## 2018-09-08 NOTE — Telephone Encounter (Signed)
Pt is requesting refill of testosterone, will forward to provider for review.

## 2018-09-09 DIAGNOSIS — M7022 Olecranon bursitis, left elbow: Secondary | ICD-10-CM | POA: Diagnosis not present

## 2018-09-12 ENCOUNTER — Other Ambulatory Visit: Payer: Self-pay | Admitting: Family Medicine

## 2018-09-13 ENCOUNTER — Other Ambulatory Visit: Payer: Self-pay | Admitting: Family Medicine

## 2018-09-15 ENCOUNTER — Other Ambulatory Visit: Payer: Self-pay | Admitting: Family Medicine

## 2018-09-16 DIAGNOSIS — G5622 Lesion of ulnar nerve, left upper limb: Secondary | ICD-10-CM | POA: Diagnosis not present

## 2018-09-21 ENCOUNTER — Other Ambulatory Visit: Payer: Self-pay

## 2018-09-21 ENCOUNTER — Other Ambulatory Visit: Payer: Self-pay | Admitting: Orthopedic Surgery

## 2018-09-21 ENCOUNTER — Encounter (HOSPITAL_BASED_OUTPATIENT_CLINIC_OR_DEPARTMENT_OTHER): Payer: Self-pay | Admitting: *Deleted

## 2018-09-21 NOTE — Progress Notes (Signed)
Instructed to bring CPAP machine tomorrow. Pt stated he could not come in for EKG today. Told him we will do tomorrow.

## 2018-09-22 ENCOUNTER — Ambulatory Visit (HOSPITAL_BASED_OUTPATIENT_CLINIC_OR_DEPARTMENT_OTHER): Payer: 59 | Admitting: Anesthesiology

## 2018-09-22 ENCOUNTER — Ambulatory Visit (HOSPITAL_BASED_OUTPATIENT_CLINIC_OR_DEPARTMENT_OTHER)
Admission: RE | Admit: 2018-09-22 | Discharge: 2018-09-22 | Disposition: A | Discharge status: Home / Self Care | Payer: 59 | Attending: Orthopedic Surgery | Admitting: Orthopedic Surgery

## 2018-09-22 ENCOUNTER — Encounter (HOSPITAL_BASED_OUTPATIENT_CLINIC_OR_DEPARTMENT_OTHER)
Admission: RE | Disposition: A | Discharge status: Home / Self Care | Payer: Self-pay | Source: Home / Self Care | Attending: Orthopedic Surgery

## 2018-09-22 ENCOUNTER — Encounter (HOSPITAL_BASED_OUTPATIENT_CLINIC_OR_DEPARTMENT_OTHER): Payer: Self-pay

## 2018-09-22 DIAGNOSIS — Z79899 Other long term (current) drug therapy: Secondary | ICD-10-CM | POA: Diagnosis not present

## 2018-09-22 DIAGNOSIS — I10 Essential (primary) hypertension: Secondary | ICD-10-CM | POA: Diagnosis not present

## 2018-09-22 DIAGNOSIS — G4733 Obstructive sleep apnea (adult) (pediatric): Secondary | ICD-10-CM | POA: Insufficient documentation

## 2018-09-22 DIAGNOSIS — M7981 Nontraumatic hematoma of soft tissue: Secondary | ICD-10-CM | POA: Diagnosis not present

## 2018-09-22 DIAGNOSIS — L7634 Postprocedural seroma of skin and subcutaneous tissue following other procedure: Secondary | ICD-10-CM | POA: Insufficient documentation

## 2018-09-22 DIAGNOSIS — Z888 Allergy status to other drugs, medicaments and biological substances status: Secondary | ICD-10-CM | POA: Insufficient documentation

## 2018-09-22 DIAGNOSIS — M7989 Other specified soft tissue disorders: Secondary | ICD-10-CM | POA: Diagnosis not present

## 2018-09-22 HISTORY — PX: INCISION AND DRAINAGE: SHX5863

## 2018-09-22 HISTORY — DX: Essential (primary) hypertension: I10

## 2018-09-22 SURGERY — INCISION AND DRAINAGE
Anesthesia: General | Site: Elbow | Laterality: Left

## 2018-09-22 MED ORDER — LIDOCAINE 2% (20 MG/ML) 5 ML SYRINGE
INTRAMUSCULAR | Status: AC
  Filled 2018-09-22: qty 5

## 2018-09-22 MED ORDER — DEXAMETHASONE SODIUM PHOSPHATE 10 MG/ML IJ SOLN
INTRAMUSCULAR | Status: AC
  Filled 2018-09-22: qty 1

## 2018-09-22 MED ORDER — BUPIVACAINE HCL (PF) 0.25 % IJ SOLN
INTRAMUSCULAR | Status: AC
  Filled 2018-09-22: qty 30

## 2018-09-22 MED ORDER — CEFAZOLIN SODIUM-DEXTROSE 2-4 GM/100ML-% IV SOLN
2.0000 g | INTRAVENOUS | Status: AC
  Administered 2018-09-22: 2 g via INTRAVENOUS

## 2018-09-22 MED ORDER — KETOROLAC TROMETHAMINE 30 MG/ML IJ SOLN
INTRAMUSCULAR | Status: AC
  Filled 2018-09-22: qty 1

## 2018-09-22 MED ORDER — MIDAZOLAM HCL 2 MG/2ML IJ SOLN
1.0000 mg | INTRAMUSCULAR | Status: DC | PRN
  Administered 2018-09-22: 08:00:00 2 mg via INTRAVENOUS

## 2018-09-22 MED ORDER — LIDOCAINE 2% (20 MG/ML) 5 ML SYRINGE
INTRAMUSCULAR | Status: DC | PRN
  Administered 2018-09-22: 100 mg via INTRAVENOUS

## 2018-09-22 MED ORDER — LACTATED RINGERS IV SOLN
INTRAVENOUS | Status: DC
  Administered 2018-09-22: 10 mL/h via INTRAVENOUS

## 2018-09-22 MED ORDER — DEXAMETHASONE SODIUM PHOSPHATE 10 MG/ML IJ SOLN
INTRAMUSCULAR | Status: DC | PRN
  Administered 2018-09-22: 5 mg via INTRAVENOUS

## 2018-09-22 MED ORDER — CEFAZOLIN SODIUM-DEXTROSE 2-4 GM/100ML-% IV SOLN
INTRAVENOUS | Status: AC
  Filled 2018-09-22: qty 100

## 2018-09-22 MED ORDER — ONDANSETRON HCL 4 MG/2ML IJ SOLN
INTRAMUSCULAR | Status: DC | PRN
  Administered 2018-09-22: 4 mg via INTRAVENOUS

## 2018-09-22 MED ORDER — ACETAMINOPHEN 500 MG PO TABS
ORAL_TABLET | ORAL | Status: AC
  Filled 2018-09-22: qty 2

## 2018-09-22 MED ORDER — ARTIFICIAL TEARS OPHTHALMIC OINT
TOPICAL_OINTMENT | OPHTHALMIC | Status: AC
  Filled 2018-09-22: qty 3.5

## 2018-09-22 MED ORDER — FENTANYL CITRATE (PF) 100 MCG/2ML IJ SOLN
INTRAMUSCULAR | Status: AC
  Filled 2018-09-22: qty 2

## 2018-09-22 MED ORDER — FENTANYL CITRATE (PF) 100 MCG/2ML IJ SOLN: 25.0000 ug | INTRAMUSCULAR | Status: DC | PRN

## 2018-09-22 MED ORDER — CHLORHEXIDINE GLUCONATE 4 % EX LIQD: 60.0000 mL | Freq: Once | CUTANEOUS | Status: DC

## 2018-09-22 MED ORDER — PROPOFOL 10 MG/ML IV BOLUS
INTRAVENOUS | Status: DC | PRN
  Administered 2018-09-22: 100 mg via INTRAVENOUS
  Administered 2018-09-22: 200 mg via INTRAVENOUS
  Administered 2018-09-22: 100 mg via INTRAVENOUS

## 2018-09-22 MED ORDER — MIDAZOLAM HCL 2 MG/2ML IJ SOLN
INTRAMUSCULAR | Status: AC
  Filled 2018-09-22: qty 2

## 2018-09-22 MED ORDER — SCOPOLAMINE 1 MG/3DAYS TD PT72: 1.0000 | MEDICATED_PATCH | Freq: Once | TRANSDERMAL | Status: DC | PRN

## 2018-09-22 MED ORDER — BUPIVACAINE HCL (PF) 0.25 % IJ SOLN
INTRAMUSCULAR | Status: DC | PRN
  Administered 2018-09-22: 12 mL

## 2018-09-22 MED ORDER — ONDANSETRON HCL 4 MG/2ML IJ SOLN
INTRAMUSCULAR | Status: AC
  Filled 2018-09-22: qty 2

## 2018-09-22 MED ORDER — ACETAMINOPHEN 500 MG PO TABS
1000.0000 mg | ORAL_TABLET | Freq: Once | ORAL | Status: AC
  Administered 2018-09-22: 1000 mg via ORAL

## 2018-09-22 MED ORDER — PROPOFOL 10 MG/ML IV BOLUS
INTRAVENOUS | Status: AC
  Filled 2018-09-22: qty 40

## 2018-09-22 MED ORDER — FENTANYL CITRATE (PF) 100 MCG/2ML IJ SOLN
50.0000 ug | INTRAMUSCULAR | Status: AC | PRN
  Administered 2018-09-22: 50 ug via INTRAVENOUS
  Administered 2018-09-22 (×2): 25 ug via INTRAVENOUS

## 2018-09-22 SURGICAL SUPPLY — 67 items
APL SKNCLS STERI-STRIP NONHPOA (GAUZE/BANDAGES/DRESSINGS) ×1
BAG DECANTER FOR FLEXI CONT (MISCELLANEOUS) IMPLANT
BANDAGE ACE 3X5.8 VEL STRL LF (GAUZE/BANDAGES/DRESSINGS) ×2 IMPLANT
BANDAGE ACE 4X5 VEL STRL LF (GAUZE/BANDAGES/DRESSINGS) IMPLANT
BANDAGE COBAN LF 1.5X5 NS (GAUZE/BANDAGES/DRESSINGS) IMPLANT
BENZOIN TINCTURE PRP APPL 2/3 (GAUZE/BANDAGES/DRESSINGS) ×2 IMPLANT
BLADE SURG 15 STRL LF DISP TIS (BLADE) ×1 IMPLANT
BLADE SURG 15 STRL SS (BLADE) ×2
BNDG CMPR 9X4 STRL LF SNTH (GAUZE/BANDAGES/DRESSINGS)
BNDG ESMARK 4X9 LF (GAUZE/BANDAGES/DRESSINGS) IMPLANT
BNDG GAUZE ELAST 4 BULKY (GAUZE/BANDAGES/DRESSINGS) IMPLANT
CORD BIPOLAR FORCEPS 12FT (ELECTRODE) ×2 IMPLANT
COVER BACK TABLE REUSABLE LG (DRAPES) ×2 IMPLANT
COVER WAND RF STERILE (DRAPES) IMPLANT
CUFF TOURN SGL QUICK 18X4 (TOURNIQUET CUFF) IMPLANT
DECANTER SPIKE VIAL GLASS SM (MISCELLANEOUS) ×2 IMPLANT
DRAPE EXTREMITY T 121X128X90 (DISPOSABLE) ×2 IMPLANT
DRAPE SURG 17X23 STRL (DRAPES) ×2 IMPLANT
DURAPREP 26ML APPLICATOR (WOUND CARE) ×2 IMPLANT
EVACUATOR 1/8 PVC DRAIN (DRAIN) ×2 IMPLANT
GAUZE PACKING IODOFORM 1/4X15 (GAUZE/BANDAGES/DRESSINGS) IMPLANT
GAUZE SPONGE 4X4 12PLY STRL (GAUZE/BANDAGES/DRESSINGS) ×2 IMPLANT
GAUZE XEROFORM 1X8 LF (GAUZE/BANDAGES/DRESSINGS) IMPLANT
GLOVE SURG SYN 8.0 (GLOVE) ×4 IMPLANT
GOWN STRL REIN XL XLG (GOWN DISPOSABLE) ×2 IMPLANT
GOWN STRL REUS W/ TWL LRG LVL3 (GOWN DISPOSABLE) ×1 IMPLANT
GOWN STRL REUS W/ TWL XL LVL3 (GOWN DISPOSABLE) ×2 IMPLANT
GOWN STRL REUS W/TWL LRG LVL3 (GOWN DISPOSABLE) ×2
GOWN STRL REUS W/TWL XL LVL3 (GOWN DISPOSABLE) ×4
HANDPIECE INTERPULSE COAX TIP (DISPOSABLE)
IV NS IRRIG 3000ML ARTHROMATIC (IV SOLUTION) IMPLANT
LOOP VESSEL MAXI BLUE (MISCELLANEOUS) IMPLANT
NEEDLE HYPO 25X1 1.5 SAFETY (NEEDLE) IMPLANT
NEEDLE PRECISIONGLIDE 27X1.5 (NEEDLE) ×2 IMPLANT
NS IRRIG 1000ML POUR BTL (IV SOLUTION) IMPLANT
PACK BASIN DAY SURGERY FS (CUSTOM PROCEDURE TRAY) ×2 IMPLANT
PAD CAST 3X4 CTTN HI CHSV (CAST SUPPLIES) ×1 IMPLANT
PADDING CAST ABS 3INX4YD NS (CAST SUPPLIES) ×1
PADDING CAST ABS 4INX4YD NS (CAST SUPPLIES) ×1
PADDING CAST ABS COTTON 3X4 (CAST SUPPLIES) ×1 IMPLANT
PADDING CAST ABS COTTON 4X4 ST (CAST SUPPLIES) ×1 IMPLANT
PADDING CAST COTTON 3X4 STRL (CAST SUPPLIES) ×2
SET HNDPC FAN SPRY TIP SCT (DISPOSABLE) IMPLANT
SHEET MEDIUM DRAPE 40X70 STRL (DRAPES) ×2 IMPLANT
SPLINT PLASTER CAST XFAST 3X15 (CAST SUPPLIES) IMPLANT
SPLINT PLASTER CAST XFAST 4X15 (CAST SUPPLIES) ×5 IMPLANT
SPLINT PLASTER XTRA FAST SET 4 (CAST SUPPLIES) ×5
SPLINT PLASTER XTRA FASTSET 3X (CAST SUPPLIES)
SPONGE LAP 4X18 RFD (DISPOSABLE) ×2 IMPLANT
STOCKINETTE 4X48 STRL (DRAPES) ×2 IMPLANT
STRIP CLOSURE SKIN 1/2X4 (GAUZE/BANDAGES/DRESSINGS) ×2 IMPLANT
SUT ETHILON 3 0 PS 1 (SUTURE) IMPLANT
SUT ETHILON 5 0 PS 2 18 (SUTURE) IMPLANT
SUT PROLENE 3 0 PS 2 (SUTURE) IMPLANT
SUT VIC AB 4-0 P-3 18XBRD (SUTURE) IMPLANT
SUT VIC AB 4-0 P3 18 (SUTURE)
SUT VICRYL RAPIDE 4-0 (SUTURE) IMPLANT
SUT VICRYL RAPIDE 4/0 PS 2 (SUTURE) IMPLANT
SWAB COLLECTION DEVICE MRSA (MISCELLANEOUS) IMPLANT
SWAB CULTURE ESWAB REG 1ML (MISCELLANEOUS) IMPLANT
SYR 10ML LL (SYRINGE) ×2 IMPLANT
SYR BULB 3OZ (MISCELLANEOUS) ×2 IMPLANT
SYR CONTROL 10ML LL (SYRINGE) ×2 IMPLANT
TOWEL GREEN STERILE FF (TOWEL DISPOSABLE) ×2 IMPLANT
TUBE CONNECTING 20X1/4 (TUBING) IMPLANT
UNDERPAD 30X30 (UNDERPADS AND DIAPERS) ×2 IMPLANT
YANKAUER SUCT BULB TIP NO VENT (SUCTIONS) IMPLANT

## 2018-09-22 NOTE — Op Note (Signed)
Please see dictated report [phone]

## 2018-09-22 NOTE — H&P (Signed)
Glen Chambers is an 58 y.o. male.   Chief Complaint: Left elbow pain and swelling with persistent seroma HPI: Patient is a very pleasant 58 year old male who is 6 weeks status post left ulnar nerve decompression and anterior transposition with excision of large ganglion cyst from elbow joint with persistent recurrent seroma formation over the past 2 weeks  Past Medical History:  Diagnosis Date  . Hypertension   . Median nerve injury   . OSA (obstructive sleep apnea) 09/11/2016   Uses CPAP every night   . Ulnar nerve damage, right, initial encounter     Past Surgical History:  Procedure Laterality Date  . CARPAL TUNNEL RELEASE    . CARPAL TUNNEL WITH CUBITAL TUNNEL    . HERNIA REPAIR     left  and right    Family History  Problem Relation Age of Onset  . Hypertension Mother   . Heart disease Father   . Hypertension Father    Social History:  reports that he has never smoked. He has never used smokeless tobacco. He reports current alcohol use. He reports that he does not use drugs.  Allergies:  Allergies  Allergen Reactions  . Lisinopril Cough    Mild cough with lisinopril    Medications Prior to Admission  Medication Sig Dispense Refill  . amLODipine (NORVASC) 10 MG tablet TAKE 1 TABLET BY MOUTH EVERY DAY 90 tablet 1  . atorvastatin (LIPITOR) 20 MG tablet Take 1 tablet (20 mg total) by mouth daily. 90 tablet 3  . Candesartan Cilexetil-HCTZ 32-25 MG TABS Take 1 tablet by mouth daily. 90 each 1  . Multiple Vitamin (MULTIVITAMIN) tablet Take 1 tablet by mouth daily.    . tadalafil (ADCIRCA/CIALIS) 20 MG tablet Take 0.5-1 tablets (10-20 mg total) by mouth every other day as needed for erectile dysfunction. 30 tablet 11  . testosterone cypionate (DEPOTESTOSTERONE CYPIONATE) 200 MG/ML injection INJECT 1ML INTO THE MUSCLE EVERY 14 DAYS 6 mL 0  . vitamin C (ASCORBIC ACID) 250 MG tablet Take 250 mg by mouth daily.    Dorian Heckle PROTEIN SHAKE PO Take by mouth.    . AMBULATORY NON  FORMULARY MEDICATION 31ml syringe, 21g 1.5 inch needles and 18 gauge blunt filler needles for testosterone injections every 2 weeks. Disp qs x 3 months. 1 each 0  . AMBULATORY NON FORMULARY MEDICATION Send 30 day CPAP report.  [facility] 1 each 0  . NEEDLE, DISP, 18 G (BD HYPODERMIC NEEDLE) 18G X 1" MISC USE AS DIRECTED TO ADMINISTER TESTOSTERONE.*NOT COVERED BY INSURANCE 100 each 0  . SYRINGE-NEEDLE, DISP, 3 ML (B-D 3CC LUER-LOK SYR 21GX1-1/2) 21G X 1-1/2" 3 ML MISC USE AS DIRECTED TO ADMINISTER TESTOSTERONE. 50 each 0    No results found for this or any previous visit (from the past 48 hour(s)). No results found.  Review of Systems  All other systems reviewed and are negative.   Blood pressure (!) 143/79, pulse 97, temperature 98.5 F (36.9 C), temperature source Oral, resp. rate 18, height 5\' 9"  (1.753 m), weight 99.2 kg. Physical Exam  Constitutional: He is oriented to person, place, and time. He appears well-developed and well-nourished.  HENT:  Head: Normocephalic and atraumatic.  Neck: Normal range of motion.  Cardiovascular: Normal rate.  Respiratory: Effort normal.  Musculoskeletal:     Left elbow: He exhibits swelling and effusion. Tenderness found.     Comments: Left elbow persistent seroma  Neurological: He is alert and oriented to person, place, and time.  Skin: Skin  is warm.  Psychiatric: He has a normal mood and affect. His behavior is normal. Judgment and thought content normal.     Assessment/Plan 58 year old male with persistent postoperative seroma medial aspect left elbow.  Have discussed the role of exploration of the elbow and possible placement of postoperative drain.  He understands risks and benefits and wishes to proceed  Schuyler Amor, MD 09/22/2018, 8:16 AM

## 2018-09-22 NOTE — Anesthesia Preprocedure Evaluation (Addendum)
Anesthesia Evaluation  Patient identified by MRN, date of birth, ID band Patient awake    Reviewed: Allergy & Precautions, NPO status , Patient's Chart, lab work & pertinent test results  Airway Mallampati: II  TM Distance: >3 FB Neck ROM: Full    Dental no notable dental hx. (+) Teeth Intact, Dental Advisory Given,    Pulmonary sleep apnea and Continuous Positive Airway Pressure Ventilation ,    Pulmonary exam normal breath sounds clear to auscultation       Cardiovascular hypertension, Pt. on medications negative cardio ROS Normal cardiovascular exam Rhythm:Regular Rate:Normal  TTE 2018 - normal LV systolic function; mild LVH; mild   diastolic dysfunction; elevated LV filling pressure; mild MR.   Neuro/Psych PSYCHIATRIC DISORDERS Anxiety negative neurological ROS     GI/Hepatic negative GI ROS, Neg liver ROS,   Endo/Other  negative endocrine ROS  Renal/GU negative Renal ROS  negative genitourinary   Musculoskeletal negative musculoskeletal ROS (+)   Abdominal   Peds  Hematology negative hematology ROS (+)   Anesthesia Other Findings   Reproductive/Obstetrics                          Anesthesia Physical Anesthesia Plan  ASA: II  Anesthesia Plan: General   Post-op Pain Management:    Induction: Intravenous  PONV Risk Score and Plan: 2 and Ondansetron, Dexamethasone and Midazolam  Airway Management Planned: LMA  Additional Equipment:   Intra-op Plan:   Post-operative Plan: Extubation in OR  Informed Consent: I have reviewed the patients History and Physical, chart, labs and discussed the procedure including the risks, benefits and alternatives for the proposed anesthesia with the patient or authorized representative who has indicated his/her understanding and acceptance.     Dental advisory given  Plan Discussed with: CRNA  Anesthesia Plan Comments:          Anesthesia Quick Evaluation

## 2018-09-22 NOTE — Transfer of Care (Signed)
Immediate Anesthesia Transfer of Care Note  Patient: Glen Chambers  Procedure(s) Performed: INCISION AND DRAINAGE LEFT ELBOW SEROMA (Left Elbow)  Patient Location: PACU  Anesthesia Type:General  Level of Consciousness: awake, alert , oriented and patient cooperative  Airway & Oxygen Therapy: Patient Spontanous Breathing and Patient connected to face mask oxygen  Post-op Assessment: Report given to RN and Post -op Vital signs reviewed and stable  Post vital signs: Reviewed and stable  Last Vitals:  Vitals Value Taken Time  BP    Temp    Pulse    Resp    SpO2      Last Pain:  Vitals:   09/22/18 0737  TempSrc: Oral  PainSc: 0-No pain      Patients Stated Pain Goal: 4 (92/01/00 7121)  Complications: No apparent anesthesia complications

## 2018-09-22 NOTE — Discharge Instructions (Signed)

## 2018-09-22 NOTE — Progress Notes (Deleted)
Assisted Dr. Lanetta Inch with left, ultrasound guided, supraclavicular block. Side rails up, monitors on throughout procedure. See vital signs in flow sheet. Tolerated Procedure well.

## 2018-09-22 NOTE — Op Note (Signed)
NAMEBATU, CASSIN MEDICAL RECORD [id number] ACCOUNT [id number] DATE OF BIRTH:[birth year] FACILITY: [facility] LOCATION: [facility] PHYSICIAN:Terel Bann A. Burney Gauze, MD  OPERATIVE REPORT  DATE OF PROCEDURE:  09/22/2018  PREOPERATIVE DIAGNOSIS:  Recurrent seroma medial aspect of left elbow.  POSTOPERATIVE DIAGNOSIS:  Recurrent seroma medial aspect of left elbow.  PROCEDURE:  Exploration of medial aspect of left elbow with culturing, drainage of seroma and primary wound closure over Hemovac drain.  SURGEON:  Charlotte Crumb, MD  ASSISTANT:  Dasnoit, PA.  ANESTHESIA:  General.  COMPLICATIONS:  No complications.  DESCRIPTION OF PROCEDURE:  The patient was taken to the operating suite after induction of adequate general anesthetic.  Left upper extremity was prepped and draped in the usual sterile fashion.  An Esmarch was used to exsanguinate the limb and the  tourniquet was inflated to 250 mmHg.  At this point in time, we incised the medial aspect of his left elbow using his prior incision.  Dissection was carried down to the anteriorly transposed ulnar nerve.  We carefully identified and decompressed some  scarring proximally and distally.  The nerve was in anterior position and stable to flexion and extension.  There was a soft tissue lesion on the proximal aspect of the incision overlying the triceps muscle consistent with a possible cystic lesion.  It  was carefully excised and sent for pathologic confirmation.  We thoroughly explored the depths of the wound both proximally and distally.  At this point in time, we irrigated and dropped the tourniquet for 5 minutes.  We then carefully inspected the  wound proximally and distally and there were no obvious sources of arterial bleeding or any evidence of joint fluid.  We irrigated and repacked the wound for 5 minutes and then loosely close the wound over a medium Hemovac drain using 2-0 undyed Vicryl  and a 3-0 Prolene subcuticular stitch on the  skin.  Steri-Strips, 4 x 4 fluffs and a posterior elbow splint was applied.  The patient tolerated this procedure well and went to recovery room in stable fashion.  TN/NUANCE  D:09/22/2018 T:09/22/2018 JOB:[id number]

## 2018-09-22 NOTE — Anesthesia Postprocedure Evaluation (Signed)
Anesthesia Post Note  Patient: Glen Chambers  Procedure(s) Performed: INCISION AND DRAINAGE LEFT ELBOW SEROMA (Left Elbow)     Patient location during evaluation: PACU Anesthesia Type: General Level of consciousness: awake and alert Pain management: pain level controlled Vital Signs Assessment: post-procedure vital signs reviewed and stable Respiratory status: spontaneous breathing, nonlabored ventilation, respiratory function stable and patient connected to nasal cannula oxygen Cardiovascular status: blood pressure returned to baseline and stable Postop Assessment: no apparent nausea or vomiting Anesthetic complications: no    Last Vitals:  Vitals:   09/22/18 1045 09/22/18 1115  BP: 113/71 125/80  Pulse: 78 72  Resp: 13 18  Temp:  36.9 C  SpO2: 97% 98%    Last Pain:  Vitals:   09/22/18 1115  TempSrc:   PainSc: 0-No pain                 Carlyann Placide L Anastacia Reinecke

## 2018-09-22 NOTE — Anesthesia Procedure Notes (Signed)
Procedure Name: LMA Insertion Date/Time: 09/22/2018 8:36 AM Performed by: Wanita Chamberlain, CRNA Pre-anesthesia Checklist: Patient identified, Emergency Drugs available, Suction available and Patient being monitored Patient Re-evaluated:Patient Re-evaluated prior to induction Oxygen Delivery Method: Circle system utilized Preoxygenation: Pre-oxygenation with 100% oxygen Induction Type: IV induction Ventilation: Mask ventilation without difficulty LMA: LMA inserted LMA Size: 5.0 Number of attempts: 1 Placement Confirmation: breath sounds checked- equal and bilateral,  CO2 detector and positive ETCO2 Tube secured with: Tape Dental Injury: Teeth and Oropharynx as per pre-operative assessment

## 2018-09-23 ENCOUNTER — Encounter (HOSPITAL_BASED_OUTPATIENT_CLINIC_OR_DEPARTMENT_OTHER): Payer: Self-pay | Admitting: Orthopedic Surgery

## 2018-09-30 LAB — AEROBIC/ANAEROBIC CULTURE W GRAM STAIN (SURGICAL/DEEP WOUND): Gram Stain: NONE SEEN

## 2018-10-26 ENCOUNTER — Encounter: Payer: Self-pay | Admitting: Family Medicine

## 2018-10-26 ENCOUNTER — Telehealth: Payer: Self-pay | Admitting: Family Medicine

## 2018-10-26 DIAGNOSIS — C439 Malignant melanoma of skin, unspecified: Secondary | ICD-10-CM

## 2018-10-26 HISTORY — DX: Malignant melanoma of skin, unspecified: C43.9

## 2018-10-26 NOTE — Telephone Encounter (Signed)
Received fax from [facility] [place] [facility].  Patient had skin biopsy showing malignant melanoma right abdomen with thickness of 1.1 mm.  Plan for referral to Dr. Edgar Frisk for excision and possible sentinel node biopsy.

## 2018-10-26 NOTE — Telephone Encounter (Signed)
Please contact Glen Chambers and let him know that I just got the results back from dermatology and I am here for him if he needs anything or needs to talk or has worsening anxiety.

## 2018-12-04 ENCOUNTER — Other Ambulatory Visit: Payer: Self-pay | Admitting: Family Medicine

## 2018-12-07 ENCOUNTER — Encounter: Payer: Self-pay | Admitting: Family Medicine

## 2018-12-07 ENCOUNTER — Telehealth: Payer: Self-pay | Admitting: Family Medicine

## 2018-12-07 NOTE — Telephone Encounter (Signed)
Please call pt and get scheduled for virtual appt with Dr Georgina Snell

## 2018-12-07 NOTE — Telephone Encounter (Signed)
Left voicemail with information below. Let patient know to call us back to schedule a virtual.

## 2018-12-08 ENCOUNTER — Encounter: Payer: Self-pay | Admitting: Family Medicine

## 2018-12-08 ENCOUNTER — Telehealth (INDEPENDENT_AMBULATORY_CARE_PROVIDER_SITE_OTHER): Payer: 59 | Admitting: Family Medicine

## 2018-12-08 VITALS — BP 128/78 | Ht 69.0 in | Wt 212.0 lb

## 2018-12-08 DIAGNOSIS — C439 Malignant melanoma of skin, unspecified: Secondary | ICD-10-CM | POA: Diagnosis not present

## 2018-12-08 DIAGNOSIS — R7989 Other specified abnormal findings of blood chemistry: Secondary | ICD-10-CM

## 2018-12-08 DIAGNOSIS — I10 Essential (primary) hypertension: Secondary | ICD-10-CM

## 2018-12-08 DIAGNOSIS — D582 Other hemoglobinopathies: Secondary | ICD-10-CM

## 2018-12-08 DIAGNOSIS — E782 Mixed hyperlipidemia: Secondary | ICD-10-CM | POA: Diagnosis not present

## 2018-12-08 DIAGNOSIS — G5622 Lesion of ulnar nerve, left upper limb: Secondary | ICD-10-CM
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
## 2018-12-10 MED ORDER — GABAPENTIN 300 MG PO CAPS: 300.0000 mg | ORAL_CAPSULE | Freq: Three times a day (TID) | ORAL | 3 refills | Status: DC

## 2019-01-10 ENCOUNTER — Telehealth: Payer: Self-pay | Admitting: Family Medicine

## 2019-01-10 NOTE — Telephone Encounter (Signed)
Please make sure to get the labs that we ordered in June done.  Labs should be fasting in between testosterone injections

## 2019-01-10 NOTE — Telephone Encounter (Signed)
Left brief message for patient to call back.

## 2019-01-11 ENCOUNTER — Encounter: Payer: Self-pay | Admitting: Family Medicine

## 2019-01-19 NOTE — Telephone Encounter (Signed)
MyChart message sent to pt with remind to obtain labs.  Charyl Bigger, CMA

## 2019-01-22 LAB — COMPLETE METABOLIC PANEL WITH GFR
AG Ratio: 2 (calc) (ref 1.0–2.5)
ALT: 38 U/L (ref 9–46)
AST: 25 U/L (ref 10–35)
Albumin: 4.3 g/dL (ref 3.6–5.1)
Alkaline phosphatase (APISO): 44 U/L (ref 35–144)
BUN/Creatinine Ratio: 14 (calc) (ref 6–22)
BUN: 20 mg/dL (ref 7–25)
CO2: 27 mmol/L (ref 20–32)
Calcium: 9.1 mg/dL (ref 8.6–10.3)
Chloride: 104 mmol/L (ref 98–110)
Creat: 1.43 mg/dL — ABNORMAL HIGH (ref 0.70–1.33)
GFR, Est African American: 62 mL/min/{1.73_m2} (ref >=60)
GFR, Est Non African American: 54 mL/min/{1.73_m2} — ABNORMAL LOW (ref >=60)
Globulin: 2.1 g/dL (calc) (ref 1.9–3.7)
Glucose, Bld: 92 mg/dL (ref 65–99)
Potassium: 4.3 mmol/L (ref 3.5–5.3)
Sodium: 137 mmol/L (ref 135–146)
Total Bilirubin: 0.7 mg/dL (ref 0.2–1.2)
Total Protein: 6.4 g/dL (ref 6.1–8.1)

## 2019-01-22 LAB — CBC
HCT: 51.8 % — ABNORMAL HIGH (ref 38.5–50.0)
Hemoglobin: 17.3 g/dL — ABNORMAL HIGH (ref 13.2–17.1)
MCH: 32.7 pg (ref 27.0–33.0)
MCHC: 33.4 g/dL (ref 32.0–36.0)
MCV: 97.9 fL (ref 80.0–100.0)
MPV: 10.8 fL (ref 7.5–12.5)
Platelets: 212 10*3/uL (ref 140–400)
RBC: 5.29 10*6/uL (ref 4.20–5.80)
RDW: 12.3 % (ref 11.0–15.0)
WBC: 8.3 10*3/uL (ref 3.8–10.8)

## 2019-01-22 LAB — LIPID PANEL W/REFLEX DIRECT LDL
Cholesterol: 138 mg/dL (ref <200)
HDL: 37 mg/dL — ABNORMAL LOW (ref >=40)
LDL Cholesterol (Calc): 84 mg/dL (calc)
Non-HDL Cholesterol (Calc): 101 mg/dL (calc) (ref <130)
Total CHOL/HDL Ratio: 3.7 (calc) (ref <5.0)
Triglycerides: 80 mg/dL (ref <150)

## 2019-01-22 LAB — PSA: PSA: 1.8 ng/mL (ref <=4.0)

## 2019-01-22 LAB — TESTOSTERONE: Testosterone: 676 ng/dL (ref 250–827)

## 2019-01-26 ENCOUNTER — Encounter: Payer: Self-pay | Admitting: Family Medicine

## 2019-02-24 ENCOUNTER — Other Ambulatory Visit: Payer: Self-pay | Admitting: Family Medicine

## 2019-02-24 ENCOUNTER — Encounter: Payer: Self-pay | Admitting: Family Medicine

## 2019-02-25 MED ORDER — SILDENAFIL CITRATE 100 MG PO TABS: 50.0000 mg | ORAL_TABLET | Freq: Every day | ORAL | 11 refills | Status: DC | PRN

## 2019-02-28 ENCOUNTER — Other Ambulatory Visit: Payer: Self-pay | Admitting: Family Medicine

## 2019-03-03 ENCOUNTER — Other Ambulatory Visit: Payer: Self-pay | Admitting: Family Medicine

## 2019-03-04 ENCOUNTER — Other Ambulatory Visit: Payer: Self-pay | Admitting: Family Medicine

## 2019-03-09 ENCOUNTER — Encounter: Payer: Self-pay | Admitting: Neurology

## 2019-03-11 ENCOUNTER — Ambulatory Visit (INDEPENDENT_AMBULATORY_CARE_PROVIDER_SITE_OTHER): Payer: 59 | Admitting: Neurology

## 2019-03-11 DIAGNOSIS — Z9119 Patient's noncompliance with other medical treatment and regimen: Secondary | ICD-10-CM

## 2019-03-14 NOTE — Progress Notes (Signed)
Canceled appointment.

## 2019-03-21 ENCOUNTER — Ambulatory Visit: Payer: 59 | Admitting: Neurology

## 2019-04-01 ENCOUNTER — Ambulatory Visit: Payer: 59 | Admitting: Neurology

## 2019-04-10 ENCOUNTER — Other Ambulatory Visit: Payer: Self-pay | Admitting: Family Medicine

## 2019-04-12 ENCOUNTER — Other Ambulatory Visit: Payer: Self-pay | Admitting: Family Medicine

## 2019-04-12 MED ORDER — CANDESARTAN CILEXETIL-HCTZ 32-25 MG PO TABS: 1.0000 | ORAL_TABLET | Freq: Every day | ORAL | 6 refills | Status: DC

## 2019-04-22 ENCOUNTER — Other Ambulatory Visit: Payer: Self-pay | Admitting: Family Medicine

## 2019-05-20 ENCOUNTER — Other Ambulatory Visit: Payer: Self-pay | Admitting: Family Medicine

## 2019-06-02 DIAGNOSIS — L720 Epidermal cyst: Secondary | ICD-10-CM | POA: Insufficient documentation

## 2019-06-07 DIAGNOSIS — D0359 Melanoma in situ of other part of trunk: Secondary | ICD-10-CM | POA: Insufficient documentation

## 2019-06-30 ENCOUNTER — Other Ambulatory Visit: Payer: Self-pay | Admitting: Family Medicine

## 2019-06-30 NOTE — Telephone Encounter (Signed)
Must make appointment 

## 2019-08-17 ENCOUNTER — Other Ambulatory Visit: Payer: Self-pay | Admitting: Family Medicine

## 2019-08-17 ENCOUNTER — Other Ambulatory Visit: Payer: Self-pay | Admitting: Sports Medicine

## 2019-08-17 ENCOUNTER — Other Ambulatory Visit: Payer: Self-pay | Admitting: Osteopathic Medicine

## 2019-09-08 ENCOUNTER — Ambulatory Visit: Payer: 59 | Attending: Internal Medicine

## 2019-09-08 DIAGNOSIS — Z23 Encounter for immunization: Secondary | ICD-10-CM

## 2019-09-08 NOTE — Progress Notes (Signed)
   Covid-19 Vaccination Clinic  Name:  Glen Chambers    MRN: [id number] DOB: [birth year]  09/08/2019  Glen Chambers was observed post Covid-19 immunization for 15 minutes without incident. He was provided with Vaccine Information Sheet and instruction to access the V-Safe system.   Glen Chambers was instructed to call 911 with any severe reactions post vaccine: Marland Kitchen Difficulty breathing  . Swelling of face and throat  . A fast heartbeat  . A bad rash all over body  . Dizziness and weakness   Immunizations Administered    Name Date Dose VIS Date Route   Pfizer COVID-19 Vaccine 09/08/2019  1:48 PM 0.3 mL 05/27/2019 Intramuscular   Manufacturer: [place]   Lot: [id number]   [place]: [id number]

## 2019-09-29 ENCOUNTER — Other Ambulatory Visit: Payer: Self-pay | Admitting: Physician Assistant

## 2019-10-03 ENCOUNTER — Ambulatory Visit: Payer: 59 | Attending: Internal Medicine

## 2019-10-03 DIAGNOSIS — Z23 Encounter for immunization: Secondary | ICD-10-CM

## 2019-10-03 NOTE — Progress Notes (Signed)
   Covid-19 Vaccination Clinic  Name:  Glen Chambers    MRN: [id number] DOB: 1961/01/24  10/03/2019  [name] was observed post Covid-19 immunization for 15 minutes without incident. He was provided with Vaccine Information Sheet and instruction to access the V-Safe system.   [name] was instructed to call 911 with any severe reactions post vaccine: Marland Kitchen Difficulty breathing  . Swelling of face and throat  . A fast heartbeat  . A bad rash all over body  . Dizziness and weakness   Immunizations Administered    Name Date Dose VIS Date Route   Pfizer COVID-19 Vaccine 10/03/2019 11:44 AM 0.3 mL 08/10/2018 Intramuscular   Manufacturer: [place]   Lot: [id number]   [place]: [id number]

## 2019-10-10 ENCOUNTER — Other Ambulatory Visit: Payer: Self-pay | Admitting: Physician Assistant

## 2019-10-10 NOTE — Telephone Encounter (Signed)
Needs to establish with new PCP

## 2019-10-13 NOTE — Telephone Encounter (Signed)
I called pt and left a Vm for him to call and schedule an appt time with a pcp here in the office so we can continue to refill his meds and Dr.Corey is no longer with [place]

## 2019-11-08 ENCOUNTER — Encounter: Payer: Self-pay | Admitting: Family Medicine

## 2019-11-08 ENCOUNTER — Ambulatory Visit (INDEPENDENT_AMBULATORY_CARE_PROVIDER_SITE_OTHER): Payer: 59 | Admitting: Family Medicine

## 2019-11-08 ENCOUNTER — Other Ambulatory Visit: Payer: Self-pay

## 2019-11-08 VITALS — BP 130/82 | HR 88 | Ht 69.0 in | Wt 227.0 lb

## 2019-11-08 DIAGNOSIS — R6882 Decreased libido: Secondary | ICD-10-CM

## 2019-11-08 DIAGNOSIS — R7989 Other specified abnormal findings of blood chemistry: Secondary | ICD-10-CM | POA: Diagnosis not present

## 2019-11-08 DIAGNOSIS — D582 Other hemoglobinopathies: Secondary | ICD-10-CM

## 2019-11-08 DIAGNOSIS — I10 Essential (primary) hypertension: Secondary | ICD-10-CM

## 2019-11-08 DIAGNOSIS — C439 Malignant melanoma of skin, unspecified: Secondary | ICD-10-CM

## 2019-11-08 DIAGNOSIS — N529 Male erectile dysfunction, unspecified: Secondary | ICD-10-CM

## 2019-11-08 DIAGNOSIS — J300 Vasomotor rhinitis: Secondary | ICD-10-CM

## 2019-11-08 DIAGNOSIS — E782 Mixed hyperlipidemia: Secondary | ICD-10-CM

## 2019-11-08 MED ORDER — AZELASTINE HCL 0.1 % NA SOLN: 1.0000 | Freq: Two times a day (BID) | NASAL | 12 refills | Status: DC

## 2019-11-08 MED ORDER — "BD LUER-LOK SYRINGE 21G X 1-1/2"" 3 ML MISC": 0 refills | Status: AC

## 2019-11-08 MED ORDER — "BD HYPODERMIC NEEDLE 18G X 1"" MISC": 0 refills | Status: AC

## 2019-11-08 MED ORDER — TESTOSTERONE CYPIONATE 200 MG/ML IM SOLN: 150.0000 mg | INTRAMUSCULAR | 0 refills | Status: DC

## 2019-11-08 MED ORDER — CANDESARTAN CILEXETIL-HCTZ 32-25 MG PO TABS: 1.0000 | ORAL_TABLET | Freq: Every day | ORAL | 1 refills | Status: DC

## 2019-11-08 MED ORDER — AMLODIPINE BESYLATE 10 MG PO TABS: 10.0000 mg | ORAL_TABLET | Freq: Every day | ORAL | 1 refills | Status: DC

## 2019-11-08 MED ORDER — ATORVASTATIN CALCIUM 20 MG PO TABS: 20.0000 mg | ORAL_TABLET | Freq: Every day | ORAL | 1 refills | Status: DC

## 2019-11-08 MED ORDER — SILDENAFIL CITRATE 100 MG PO TABS: 50.0000 mg | ORAL_TABLET | Freq: Every day | ORAL | 11 refills | Status: DC | PRN

## 2019-11-08 NOTE — Patient Instructions (Signed)
Thank you for coming in today. I refilled your medicines for 3-6 months.  Schedule a meet the doctor visit with Dr Rodena Piety at [facility] for primary care.  [phone]  I can remain your doctor for sports medicine.  You look good and I think will do well.
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
## 2019-11-09 ENCOUNTER — Other Ambulatory Visit: Payer: 59

## 2019-11-15 ENCOUNTER — Other Ambulatory Visit (INDEPENDENT_AMBULATORY_CARE_PROVIDER_SITE_OTHER): Payer: 59

## 2019-11-15 ENCOUNTER — Other Ambulatory Visit: Payer: Self-pay

## 2019-11-15 DIAGNOSIS — R7989 Other specified abnormal findings of blood chemistry: Secondary | ICD-10-CM | POA: Diagnosis not present

## 2019-11-15 DIAGNOSIS — C439 Malignant melanoma of skin, unspecified: Secondary | ICD-10-CM

## 2019-11-15 DIAGNOSIS — R6882 Decreased libido: Secondary | ICD-10-CM

## 2019-11-15 DIAGNOSIS — E782 Mixed hyperlipidemia: Secondary | ICD-10-CM

## 2019-11-15 DIAGNOSIS — I10 Essential (primary) hypertension: Secondary | ICD-10-CM

## 2019-11-15 DIAGNOSIS — D582 Other hemoglobinopathies: Secondary | ICD-10-CM

## 2019-11-15 DIAGNOSIS — J300 Vasomotor rhinitis: Secondary | ICD-10-CM

## 2019-11-15 DIAGNOSIS — N529 Male erectile dysfunction, unspecified: Secondary | ICD-10-CM

## 2019-11-15 LAB — COMPREHENSIVE METABOLIC PANEL
ALT: 51 U/L (ref 0–53)
AST: 41 U/L — ABNORMAL HIGH (ref 0–37)
Albumin: 4.4 g/dL (ref 3.5–5.2)
Alkaline Phosphatase: 52 U/L (ref 39–117)
BUN: 23 mg/dL (ref 6–23)
CO2: 29 mEq/L (ref 19–32)
Calcium: 9.3 mg/dL (ref 8.4–10.5)
Chloride: 101 mEq/L (ref 96–112)
Creatinine, Ser: 1.21 mg/dL (ref 0.40–1.50)
GFR: 61.36 mL/min (ref >60.00)
Glucose, Bld: 102 mg/dL — ABNORMAL HIGH (ref 70–99)
Potassium: 4.2 mEq/L (ref 3.5–5.1)
Sodium: 138 mEq/L (ref 135–145)
Total Bilirubin: 0.8 mg/dL (ref 0.2–1.2)
Total Protein: 6.8 g/dL (ref 6.0–8.3)

## 2019-11-15 LAB — CBC
HCT: 48.3 % (ref 39.0–52.0)
Hemoglobin: 16.8 g/dL (ref 13.0–17.0)
MCHC: 34.8 g/dL (ref 30.0–36.0)
MCV: 95.7 fl (ref 78.0–100.0)
Platelets: 198 10*3/uL (ref 150.0–400.0)
RBC: 5.04 Mil/uL (ref 4.22–5.81)
RDW: 13.1 % (ref 11.5–15.5)
WBC: 8.1 10*3/uL (ref 4.0–10.5)

## 2019-11-15 LAB — PSA: PSA: 2.23 ng/mL (ref 0.10–4.00)

## 2019-11-15 LAB — TESTOSTERONE: Testosterone: 501.5 ng/dL (ref 300.00–890.00)

## 2019-11-15 LAB — LDL CHOLESTEROL, DIRECT: Direct LDL: 92 mg/dL

## 2019-11-16 NOTE — Progress Notes (Signed)
Labs look good.

## 2020-01-24 ENCOUNTER — Other Ambulatory Visit: Payer: Self-pay | Admitting: Family Medicine

## 2020-02-02 ENCOUNTER — Ambulatory Visit (INDEPENDENT_AMBULATORY_CARE_PROVIDER_SITE_OTHER): Payer: 59 | Admitting: Family Medicine

## 2020-02-02 ENCOUNTER — Other Ambulatory Visit: Payer: Self-pay

## 2020-02-02 ENCOUNTER — Encounter: Payer: Self-pay | Admitting: Family Medicine

## 2020-02-02 DIAGNOSIS — I10 Essential (primary) hypertension: Secondary | ICD-10-CM | POA: Diagnosis not present

## 2020-02-02 DIAGNOSIS — R7989 Other specified abnormal findings of blood chemistry: Secondary | ICD-10-CM

## 2020-02-02 DIAGNOSIS — J300 Vasomotor rhinitis: Secondary | ICD-10-CM | POA: Diagnosis not present

## 2020-02-02 DIAGNOSIS — E782 Mixed hyperlipidemia: Secondary | ICD-10-CM | POA: Diagnosis not present

## 2020-02-02 MED ORDER — IPRATROPIUM BROMIDE 0.03 % NA SOLN: 2.0000 | Freq: Two times a day (BID) | NASAL | 12 refills | Status: DC

## 2020-02-02 NOTE — Assessment & Plan Note (Signed)
Blood pressure is at goal at for age and co-morbidities.  I recommend continuation of amlodipine and candesartan/hctz.  In addition they were instructed to follow a low sodium diet with regular exercise to help to maintain adequate control of blood pressure.

## 2020-02-02 NOTE — Progress Notes (Signed)
Glen Chambers - 59 y.o. male MRN [id number]  Date of birth: [birth year]  Subjective Chief Complaint  Patient presents with  . Establish Care    HPI Glen Chambers is a 59 y.o. male here today for follow up visit.  He is a former patient of Dr. Georgina Chambers.  He has a history of HTN, HLD, low testosterone, melanoma, and various orthopedic problems.  He did see Dr. Georgina Chambers back in May and several medications were refilled and he had updated labs.    His BP has remained well controlled with combination of candesartan/hctz and amlodipine.  He is tolerating this well and denies side effects.  He stays pretty active and denies a high salt diet.  He has not had chest pain,shortness of breath, palpitations, headache or vision change.   He feels good on current dose of testosterone.   He is tolerating atorvastatin well.   Other main concern today is rhinitis.  He has issues with nasal congestion especially when eating.  He has tried astelin nasal spray without much improvement.  He gets relief with afrin but knows not to use this long term.    ROS:  A comprehensive ROS was completed and negative except as noted per HPI  Allergies  Allergen Reactions  . Lisinopril Cough    Mild cough with lisinopril    Past Medical History:  Diagnosis Date  . Hypertension   . Median nerve injury   . Melanoma of skin ([place]) 10/26/2018   Skin biopsy dermatology May 2020  . OSA (obstructive sleep apnea) 09/11/2016   Uses CPAP every night   . Ulnar nerve damage, right, initial encounter     Past Surgical History:  Procedure Laterality Date  . CARPAL TUNNEL RELEASE    . CARPAL TUNNEL WITH CUBITAL TUNNEL    . HERNIA REPAIR     left  and right  . INCISION AND DRAINAGE Left 09/22/2018   Procedure: INCISION AND DRAINAGE LEFT ELBOW SEROMA;  Surgeon: Charlotte Crumb, MD;  Location: [facility];  Service: Orthopedics;  Laterality: Left;    Social History   Socioeconomic History  . Marital status: Married     Spouse name: Not on file  . Number of children: 0  . Years of education: 52  . Highest education level: Not on file  Occupational History  . Occupation: Technical brewer [employer]  Tobacco Use  . Smoking status: Never Smoker  . Smokeless tobacco: Never Used  Vaping Use  . Vaping Use: Never used  Substance and Sexual Activity  . Alcohol use: Yes    Comment: few beers on weekend  . Drug use: No  . Sexual activity: Not on file  Other Topics Concern  . Not on file  Social History Narrative   No children   Drinks caffeine   Two story home   Right handed    Social Determinants of Health   Financial Resource Strain:   . Difficulty of Paying Living Expenses: Not on file  Food Insecurity:   . Worried About Charity fundraiser in the Last Year: Not on file  . Ran Out of Food in the Last Year: Not on file  Transportation Needs:   . Lack of Transportation (Medical): Not on file  . Lack of Transportation (Non-Medical): Not on file  Physical Activity:   . Days of Exercise per Week: Not on file  . Minutes of Exercise per Session: Not on file  Stress:   . Feeling of Stress :  Not on file  Social Connections:   . Frequency of Communication with Friends and Family: Not on file  . Frequency of Social Gatherings with Friends and Family: Not on file  . Attends Religious Services: Not on file  . Active Member of Clubs or Organizations: Not on file  . Attends Archivist Meetings: Not on file  . Marital Status: Not on file    Family History  Problem Relation Age of Onset  . Hypertension Mother   . Heart disease Father   . Hypertension Father     Health Maintenance  Topic Date Due  . Fecal DNA (Cologuard)  Never done  . INFLUENZA VACCINE  01/15/2020  . TETANUS/TDAP  10/22/2023  . COVID-19 Vaccine  Completed  . Hepatitis C Screening  Completed  . HIV Screening  Completed      ----------------------------------------------------------------------------------------------------------------------------------------------------------------------------------------------------------------- Physical Exam BP 138/76 (BP Location: Left Arm, Patient Position: Sitting, Cuff Size: Large)   Pulse 85   Temp 98.7 F (37.1 C) (Oral)   Ht 5' 8.9" (1.75 m)   Wt 230 lb 0.6 oz (104.3 kg)   SpO2 100%   BMI 34.07 kg/m   Physical Exam Constitutional:      Appearance: Normal appearance.  HENT:     Head: Normocephalic and atraumatic.     Nose: Nose normal. No congestion.  Eyes:     General: No scleral icterus. Cardiovascular:     Rate and Rhythm: Normal rate and regular rhythm.  Pulmonary:     Effort: Pulmonary effort is normal.     Breath sounds: Normal breath sounds.  Musculoskeletal:     Cervical back: Neck supple.  Neurological:     General: No focal deficit present.     Mental Status: He is alert.  Psychiatric:        Mood and Affect: Mood normal.     ------------------------------------------------------------------------------------------------------------------------------------------------------------------------------------------------------------------- Assessment and Plan  Essential (primary) hypertension Blood pressure is at goal at for age and co-morbidities.  I recommend continuation of amlodipine and candesartan/hctz.  In addition they were instructed to follow a low sodium diet with regular exercise to help to maintain adequate control of blood pressure.    Vasomotor rhinitis Will provide trial of atrovent nasal spray as needed.   HLD (hyperlipidemia) Lab Results  Component Value Date   LDLCALC 84 01/21/2019  He is tolerating atorvastatin well, continue.   Low testosterone in male Lab Results  Component Value Date   TESTOSTERONE 501.50 11/15/2019  Testosterone levels remain within normal range.  Continue current dosing of  depotestosterone.    Meds ordered this encounter  Medications  . ipratropium (ATROVENT) 0.03 % nasal spray    Sig: Place 2 sprays into both nostrils every 12 (twelve) hours.    Dispense:  30 mL    Refill:  12    Return in about 6 months (around 08/04/2020) for HTN.    This visit occurred during the SARS-CoV-2 public health emergency.  Safety protocols were in place, including screening questions prior to the visit, additional usage of staff PPE, and extensive cleaning of exam room while observing appropriate contact time as indicated for disinfecting solutions.

## 2020-02-02 NOTE — Assessment & Plan Note (Signed)
Lab Results  Component Value Date   TESTOSTERONE 501.50 11/15/2019  Testosterone levels remain within normal range.  Continue current dosing of depotestosterone.

## 2020-02-02 NOTE — Assessment & Plan Note (Signed)
Lab Results  Component Value Date   LDLCALC 84 01/21/2019  He is tolerating atorvastatin well, continue.

## 2020-02-02 NOTE — Patient Instructions (Signed)
Great to meet you today! Continue current medications.  Try atrovent nasal spray, let me know how this is working for you.  See me again in 6 months.

## 2020-02-02 NOTE — Assessment & Plan Note (Signed)
Will provide trial of atrovent nasal spray as needed.

## 2020-02-09 ENCOUNTER — Other Ambulatory Visit: Payer: Self-pay | Admitting: Family Medicine

## 2020-02-09 NOTE — Telephone Encounter (Signed)
Last filled 11/08/2019 #10 ml no refills by Dr. Georgina Snell with note that patient needs to establish care with new PCP  Last seen 02/02/2020 Labs drawn 11/15/2019

## 2020-02-13 ENCOUNTER — Encounter: Payer: Self-pay | Admitting: Family Medicine

## 2020-02-16 ENCOUNTER — Ambulatory Visit (INDEPENDENT_AMBULATORY_CARE_PROVIDER_SITE_OTHER): Payer: 59

## 2020-02-16 ENCOUNTER — Encounter: Payer: Self-pay | Admitting: Family Medicine

## 2020-02-16 ENCOUNTER — Ambulatory Visit (INDEPENDENT_AMBULATORY_CARE_PROVIDER_SITE_OTHER): Payer: 59 | Admitting: Family Medicine

## 2020-02-16 ENCOUNTER — Other Ambulatory Visit: Payer: Self-pay

## 2020-02-16 VITALS — BP 150/80 | HR 82 | Ht 68.0 in | Wt 229.4 lb

## 2020-02-16 DIAGNOSIS — M4306 Spondylolysis, lumbar region: Secondary | ICD-10-CM

## 2020-02-16 DIAGNOSIS — M7061 Trochanteric bursitis, right hip: Secondary | ICD-10-CM | POA: Diagnosis not present

## 2020-02-16 NOTE — Patient Instructions (Signed)
Thank you for coming in today. Get xray today.  Plan for PT.  Let me know if not better.   I will get xray results to you soon.    Hip Bursitis  Hip bursitis is swelling of a fluid-filled sac (bursa) in your hip joint. This swelling (inflammation) can be painful. This condition may come and go over time. What are the causes?  Injury to the hip.  Overuse of the muscles that surround the hip joint.  An earlier injury or surgery of the hip.  Arthritis or gout.  Diabetes.  Thyroid disease.  Infection.  In some cases, the cause may not be known. What are the signs or symptoms?  Mild or moderate pain in the hip area. Pain may get worse with movement.  Tenderness and swelling of the hip, especially on the outer side of the hip.  In rare cases, the bursa may become infected. This may cause: ? A fever. ? Warmth and redness in the area. Symptoms may come and go. How is this treated? This condition is treated by resting, icing, applying pressure (compression), and raising (elevating) the injured area. You may hear this called the RICE treatment. Treatment may also include:  Using crutches.  Draining fluid out of the bursa to help relieve swelling.  Giving a shot of (injecting) medicine that helps to reduce swelling (cortisone).  Other medicines if the bursa is infected. Follow these instructions at home: Managing pain, stiffness, and swelling   If told, put ice on the painful area. ? Put ice in a plastic bag. ? Place a towel between your skin and the bag. ? Leave the ice on for 20 minutes, 2-3 times a day. ? Raise (elevate) your hip above the level of your heart as much as you can without pain. To do this, try putting a pillow under your hips while you lie down. Stop if this causes pain. Activity  Return to your normal activities as told by your doctor. Ask your doctor what activities are safe for you.  Rest and protect your hip as much as you can until you feel  better. General instructions  Take over-the-counter and prescription medicines only as told by your doctor.  Wear wraps that put pressure on your hip (compression wraps) only as told by your doctor.  Do not use your hip to support your body weight until your doctor says that you can.  Use crutches as told by your doctor.  Gently rub and stretch your injured area as often as is comfortable.  Keep all follow-up visits as told by your doctor. This is important. How is this prevented?  Exercise regularly, as told by your doctor.  Warm up and stretch before being active.  Cool down and stretch after being active.  Avoid activities that bother your hip or cause pain.  Avoid sitting down for long periods at a time. Contact a doctor if:  You have a fever.  You get new symptoms.  You have trouble walking.  You have trouble doing everyday activities.  You have pain that gets worse.  You have pain that does not get better with medicine.  You get red skin on your hip area.  You get a feeling of warmth in your hip area. Get help right away if:  You cannot move your hip.  You have very bad pain. Summary  Hip bursitis is swelling of a fluid-filled sac (bursa) in your hip.  Hip bursitis can be painful.  Symptoms often  come and go over time.  This condition is treated with rest, ice, compression, elevation, and medicines. This information is not intended to replace advice given to you by your health care provider. Make sure you discuss any questions you have with your health care provider. Document Revised: 02/08/2018 Document Reviewed: 02/08/2018 Elsevier Patient Education  [place].

## 2020-02-16 NOTE — Progress Notes (Signed)
   I, Wendy Poet, LAT, ATC, am serving as scribe for Dr. Lynne Leader.  Glen Chambers is a 59 y.o. male who presents to [place] at [facility] today for R hip pain.  He was last seen by Dr. Georgina Snell on 11/08/19 for his low back and L elbow/hand.  Since his last visit, pt reports R hip pain x 12 weeks.  He has most recently seen a chiropractor for the last few weeks which has helped w/ his low back pain but he con't to have R hip pain.  He reports limping due to his R hip pain.  He locates his pain to his R post-lat hip and notes radiating pain into his B post thighs.  He denies any numbness/tingling into his legs.  He has been doing a HEP as prescribed by the chiropractor.  He takes 800mg  IBU bid.   Pertinent review of systems: No fevers or chills  Relevant historical information: Melanoma last year. L5 pars defect seen on CT scan 2015.   Exam:  BP (!) 150/80 (BP Location: Right Arm, Patient Position: Sitting, Cuff Size: Large)   Pulse 82   Ht 5\' 8"  (1.727 m)   Wt 229 lb 6.4 oz (104.1 kg)   SpO2 97%   BMI 34.88 kg/m  General: Well Developed, well nourished, and in no acute distress.   MSK: Right hip normal-appearing Patient motion limited internal rotation otherwise normal. Tender palpation greater trochanter laterally. Hip abduction strength diminished 4/5. External rotation strength diminished 4/5.    Lab and Radiology Results  X-ray images right hip obtained today personally and independently reviewed Mild to moderate femoral acetabular DJD.  No lesions concerning for metastatic disease. Await formal radiology review    Assessment and Plan: 59 y.o. male with right lateral hip pain ongoing for several weeks. This occurs in the setting of a back pain episode that to be muscle spasm. Fortunately back pain is improved significantly with chiropractic care however the hip pain has not. Hip pain thought to be greater trochanter bursitis/hip abductor tendinopathy and  weakness. Plan to proceed with trial of physical therapy. Will obtain x-ray today given history of melanoma. Doubtful for melanoma involvement.    Orders Placed This Encounter  Procedures  . DG HIP UNILAT WITH PELVIS 2-3 VIEWS RIGHT    Standing Status:   Future    Number of Occurrences:   1    Standing Expiration Date:   02/15/2021    Order Specific Question:   Reason for Exam (SYMPTOM  OR DIAGNOSIS REQUIRED)    Answer:   eval hip pain r    Order Specific Question:   Preferred imaging location?    Answer:   [facility]    Order Specific Question:   Radiology Contrast Protocol - do NOT remove file path    Answer:   [email or web address]  . Ambulatory referral to Physical Therapy    Referral Priority:   Routine    Referral Type:   Physical Medicine    Referral Reason:   Specialty Services Required    Requested Specialty:   Physical Therapy   No orders of the defined types were placed in this encounter.    Discussed warning signs or symptoms. Please see discharge instructions. Patient expresses understanding.   The above documentation has been reviewed and is accurate and complete Lynne Leader, M.D.

## 2020-02-17 NOTE — Progress Notes (Signed)
X-ray right hip show some arthritis.  Otherwise looks pretty normal.

## 2020-02-23 ENCOUNTER — Encounter: Payer: Self-pay | Admitting: Rehabilitative and Restorative Service Providers"

## 2020-02-23 ENCOUNTER — Other Ambulatory Visit: Payer: Self-pay

## 2020-02-23 ENCOUNTER — Ambulatory Visit (INDEPENDENT_AMBULATORY_CARE_PROVIDER_SITE_OTHER): Payer: 59 | Admitting: Rehabilitative and Restorative Service Providers"

## 2020-02-23 DIAGNOSIS — M545 Low back pain, unspecified: Secondary | ICD-10-CM

## 2020-02-23 DIAGNOSIS — R293 Abnormal posture: Secondary | ICD-10-CM

## 2020-02-23 DIAGNOSIS — R29898 Other symptoms and signs involving the musculoskeletal system: Secondary | ICD-10-CM | POA: Diagnosis not present

## 2020-02-23 DIAGNOSIS — M25551 Pain in right hip: Secondary | ICD-10-CM

## 2020-02-23 NOTE — Patient Instructions (Signed)
Access Code: [email or web address]: [email or web address]: 09/09/2021Prepared by: Rey Fors HoltExercises  Prone Press Up - 2 x daily - 7 x weekly - 1 sets - 10 reps - 2-3 sec hold  Supine Piriformis Stretch with Leg Straight - 2 x daily - 7 x weekly - 1 sets - 3 reps - 30 sec hold  Bent Knee Fallouts - 2 x daily - 7 x weekly - 1-2 sets - 10 reps - 2-3 sec hold  Standing Lumbar Extension - 2 x daily - 7 x weekly - 1 sets - 2-3 reps - 2-3 sec hold Patient Education  Scientist, forensic Posture

## 2020-02-23 NOTE — Therapy (Signed)
[place], [place], [address] Phone: [phone]   Fax:  [phone]  Physical Therapy Evaluation  Patient Details  Name: Glen Chambers MRN: [id number] Date of Birth: 1961-01-17 Referring Provider (PT): Dr Lynne Leader    Encounter Date: 02/23/2020   PT End of Session - 02/23/20 1714    Visit Number 1    Number of Visits 12    Date for PT Re-Evaluation 04/05/20    PT Start Time 1525    PT Stop Time 1620    PT Time Calculation (min) 55 min    Activity Tolerance Patient tolerated treatment well           Past Medical History:  Diagnosis Date  . Hypertension   . Median nerve injury   . Melanoma of skin ([place]) 10/26/2018   Skin biopsy dermatology May 2020  . OSA (obstructive sleep apnea) 09/11/2016   Uses CPAP every night   . Ulnar nerve damage, right, initial encounter     Past Surgical History:  Procedure Laterality Date  . CARPAL TUNNEL RELEASE    . CARPAL TUNNEL WITH CUBITAL TUNNEL    . HERNIA REPAIR     left  and right  . INCISION AND DRAINAGE Left 09/22/2018   Procedure: INCISION AND DRAINAGE LEFT ELBOW SEROMA;  Surgeon: Charlotte Crumb, MD;  Location: [place];  Service: Orthopedics;  Laterality: Left;    There were no vitals filed for this visit.    Subjective Assessment - 02/23/20 1528    Subjective Patient reports that he is very active. He noticed some LBP ~ 2-3 months; noticed increased pain in the past 6 weeks with sharp pain in the Rt hip. He was seen by a chiropractor for about 5 visits with some improvement. He was seen by Dr Georgina Snell and diagnosed with arthritis and bursitis in the Rt hip.    Pertinent History bilat ulnar nerve release; bilat CTSurgery both ~ 8 yrs with repeat Lt ~ 4 yrs ago; arthritis; LBP on and off for the past 20 yrs with pushing himself "too far" about every month resolving with rest, heat, and OTC antiinflammatory    Patient Stated Goals  walk without pain    Currently in Pain? Yes    Pain Score 4     Pain Location Hip    Pain Orientation Right    Pain Descriptors / Indicators Tightness;Nagging    Pain Type Acute pain    Pain Radiating Towards into posterior thigh to knee bilat LE's    Pain Onset More than a month ago    Pain Frequency Intermittent    Aggravating Factors  standing; walking; bending; reaching; lifting; steps    Pain Relieving Factors OTC antiinflammatory; TENS              [facility] PT Assessment - 02/23/20 0001      Assessment   Medical Diagnosis Rt hip pain     Referring Provider (PT) Dr Lynne Leader     Onset Date/Surgical Date 01/03/20   LBP since end of June 2021    Hand Dominance Right    Next MD Visit none scheduled     Prior Therapy chiropractic care 5 visits       Precautions   Precautions None      Restrictions   Weight Bearing Restrictions No      Balance Screen   Has the patient fallen in the past 6 months No  Has the patient had a decrease in activity level because of a fear of falling?  No    Is the patient reluctant to leave their home because of a fear of falling?  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     Leisure yard work; projects; grocery store; gym - 6 days/wk aerobic 30 min moderate lifting machines and free wts 45 min; hiking       Observation/Other Assessments   Focus on Therapeutic Outcomes (FOTO)  58% limitation       Sensation   Additional Comments "different" Lt lateral calf area x many yrs       Posture/Postural Control   Posture Comments hips shifted to the Lt upper body to right; head forward; trunk flexed forward in sitting; decreased lumbar lordosis in standing       AROM   Lumbar Flexion 80% pulling and pain LB to Rt hip    Lumbar Extension 60% no pain     Lumbar - Right Side Bend 80% no pain     Lumbar - Left  Side Bend 70% pulling Rt LB to hip; pinching Lt LB    Lumbar - Right Rotation 60%     Lumbar - Left Rotation %55      Strength   Overall Strength Comments WFL's bilat LE's       Flexibility   Hamstrings tight Lt     Quadriceps WFL's bilat     ITB tight Rt     Piriformis tight Rt       Palpation   Spinal mobility hypomobile lumbar spine with PA and Rt lateral mobs Grade II/III    SI assessment  asymmetry Rt elevated PSIS     Palpation comment tightness Lt psoas; Rt QL/lats/lumbar paraspinals; piriformis; gluts       Special Tests   Other special tests (-) SLR; (-) slump test       Ambulation/Gait   Gait Comments antalgic gait with limp/decreased wt bearing Rt LE; hips shifted to Lt; upper body to Rt                       Objective measurements completed on examination: See above findings.       [facility] Adult PT Treatment/Exercise - 02/23/20 0001      Self-Care   Self-Care --   discussed/trial of sitting posture/alignment      Therapeutic Activites    Therapeutic Activities --   initiated back care education     Neuro Re-ed    Neuro Re-ed Details  myofacial ball release work prone Lt psoas; supine and standing Rt posterior hip       Lumbar Exercises: Stretches   Standing Extension 5 reps   2-3 sec hands at lumbar spine    Press Ups 10 reps   2-3 sec pause to pt tolerance no pain    Piriformis Stretch Right;3 reps;30 seconds   supine travell    Piriformis Stretch Limitations some pain; extremely tight; loosened with gentle stretch and reps       Lumbar Exercises: Supine   Other Supine Lumbar Exercises alternate knee drop in supine 5-10 reps each LE       Modalities   Modalities --   has TENS unit at home - discussed placement and use  PT Long Term Goals - 02/23/20 1729      PT LONG TERM GOAL #1   Title Decrease LB and Rt hip pain allowing patient to participate in rehab exercises and return to normal functional  activities    Time 6    Period Weeks    Status New    Target Date 04/05/20      PT LONG TERM GOAL #2   Title Improve trunk and LE ROM/mobiilty with patient to demonstrate functional and painfree AROM    Time 6    Period Weeks    Status New    Target Date 04/05/20      PT LONG TERM GOAL #3   Title Improve core strength and stability allowing patient to walk for at least 20 min; return to consistent gym program    Time 6    Period Weeks    Status New    Target Date 04/05/20      PT LONG TERM GOAL #4   Title Independent in HEP    Time 6    Period Weeks    Status New    Target Date 04/05/20      PT LONG TERM GOAL #5   Title Improve FOTO to </= 36% limitation    Time 6    Period Weeks    Status New    Target Date 04/05/20                  Plan - 02/23/20 1629    Clinical Impression Statement Ron presents with chronic recurrent LBP ~ monthly over the past 10 yrs. Last flare up of LBP was ~ 2 -3 months ago with pain moving to the Rt posterior hip where symptoms have been severe. Patient has poor posture and alignment with lateral shift trunk to Rt hips to Lt; tight Lt psoas, Rt QL/lats/lumbar paraspinals/piriformis/glut med. he has limited and painful trunk and Rt LE mobility/ROM. Patient has pain with standing/walking and functional activities. He will benefit from PT to address problems identified.    Stability/Clinical Decision Making Stable/Uncomplicated    Clinical Decision Making Low    Rehab Potential Good    PT Frequency 2x / week    PT Duration 6 weeks    PT Treatment/Interventions Patient/family education;ADLs/Self Care Home Management;Aquatic Therapy;Cryotherapy;Electrical Stimulation;Iontophoresis 4mg /ml Dexamethasone;Moist Heat;Ultrasound;Gait training;Stair training;Functional mobility training;Therapeutic activities;Therapeutic exercise;Balance training;Neuromuscular re-education;Manual techniques;Dry needling;Taping    PT Next Visit Plan further assessment  of sacral asymmetries; assess response to initial exercise program; continue back care education (transfers/transitional movements/sitting for work); ergonomics; manual work vs DN Lt psoas, Rt lumbar to posterior hip(QL/lats/lumbar paraspinals/piriformis)    PT Home Exercise Plan [id number]    Consulted and Agree with Plan of Care Patient           Patient will benefit from skilled therapeutic intervention in order to improve the following deficits and impairments:  Abnormal gait, Difficulty walking, Decreased activity tolerance, Pain, Decreased balance, Hypomobility, Impaired flexibility, Improper body mechanics, Decreased mobility, Decreased strength, Postural dysfunction  Visit Diagnosis: Pain in right hip - Plan: PT plan of care cert/re-cert  Acute left-sided low back pain without sciatica - Plan: PT plan of care cert/re-cert  Other symptoms and signs involving the musculoskeletal system - Plan: PT plan of care cert/re-cert  Abnormal posture - Plan: PT plan of care cert/re-cert     Problem List Patient Active Problem List   Diagnosis Date Noted  . Vasomotor rhinitis 11/08/2019  . Melanoma in situ of  back ([place]) 06/07/2019  . Epidermal inclusion cyst 06/02/2019  . Melanoma of skin ([place]) 10/26/2018  . Bilateral carpal tunnel syndrome 07/08/2018  . ED (erectile dysfunction) 03/25/2018  . Lumbar radiculopathy 03/25/2018  . HLD (hyperlipidemia) 03/25/2018  . Pars defect of lumbar spine 03/11/2018  . Anxiety 12/23/2017  . Elevated hemoglobin ([place]) 01/26/2017  . Abnormal EKG 10/15/2016  . Low testosterone in male 10/15/2016  . OSA (obstructive sleep apnea) 09/11/2016  . Libido, decreased 09/11/2016  . Essential (primary) hypertension     Cuca Benassi Nilda Simmer PT, MPH  02/23/2020, 5:41 PM  [facility] [place], [place], [address] Phone: [phone]   Fax:  [phone]  Name: Ej Pinson MRN: [id number] Date of  Birth: 12-28-1960

## 2020-02-29 ENCOUNTER — Other Ambulatory Visit: Payer: Self-pay

## 2020-02-29 ENCOUNTER — Ambulatory Visit (INDEPENDENT_AMBULATORY_CARE_PROVIDER_SITE_OTHER): Payer: 59 | Admitting: Physical Therapy

## 2020-02-29 ENCOUNTER — Encounter: Payer: Self-pay | Admitting: Physical Therapy

## 2020-02-29 DIAGNOSIS — R293 Abnormal posture: Secondary | ICD-10-CM | POA: Diagnosis not present

## 2020-02-29 DIAGNOSIS — M25551 Pain in right hip: Secondary | ICD-10-CM | POA: Diagnosis not present

## 2020-02-29 DIAGNOSIS — M545 Low back pain, unspecified: Secondary | ICD-10-CM

## 2020-02-29 DIAGNOSIS — R29898 Other symptoms and signs involving the musculoskeletal system: Secondary | ICD-10-CM

## 2020-02-29 NOTE — Therapy (Signed)
[place], [place], [address] Phone: [phone]   Fax:  [phone]  Physical Therapy Treatment  Patient Details  Name: Glen Chambers MRN: [id number] Date of Birth: [birth year] Referring Provider (PT): Dr Lynne Leader    Encounter Date: 02/29/2020   PT End of Session - 02/29/20 0928    Visit Number 2    Number of Visits 12    Date for PT Re-Evaluation 04/05/20    PT Start Time 0928    PT Stop Time 1018    PT Time Calculation (min) 50 min    Activity Tolerance Patient tolerated treatment well    Behavior During Therapy [facility] for tasks assessed/performed           Past Medical History:  Diagnosis Date  . Hypertension   . Median nerve injury   . Melanoma of skin ([place]) 10/26/2018   Skin biopsy dermatology May 2020  . OSA (obstructive sleep apnea) 09/11/2016   Uses CPAP every night   . Ulnar nerve damage, right, initial encounter     Past Surgical History:  Procedure Laterality Date  . CARPAL TUNNEL RELEASE    . CARPAL TUNNEL WITH CUBITAL TUNNEL    . HERNIA REPAIR     left  and right  . INCISION AND DRAINAGE Left 09/22/2018   Procedure: INCISION AND DRAINAGE LEFT ELBOW SEROMA;  Surgeon: Charlotte Crumb, MD;  Location: [facility];  Service: Orthopedics;  Laterality: Left;    There were no vitals filed for this visit.   Subjective Assessment - 02/29/20 0933    Subjective Pt reports he was feeling better all day yesterday, but this morning he is stiff and painful again.    Pertinent History bilat ulnar nerve release; bilat CTSurgery both ~ 8 yrs with repeat Lt ~ 4 yrs ago; arthritis; LBP on and off for the past 20 yrs with pushing himself "too far" about every month resolving with rest, heat, and OTC antiinflammatory    Patient Stated Goals walk without pain    Currently in Pain? Yes    Pain Score 6     Pain Location Back    Pain Orientation Lower    Pain Descriptors /  Indicators Aching;Tightness    Pain Radiating Towards into Rt hip to knee    Aggravating Factors  prolonged positions    Pain Relieving Factors ball massage; TENS.              [facility] PT Assessment - 02/29/20 0001      Assessment   Medical Diagnosis Rt hip pain     Referring Provider (PT) Dr Lynne Leader     Onset Date/Surgical Date 01/03/20   LBP since end of June 2021    Hand Dominance Right    Next MD Visit PRN    Prior Therapy chiropractic care 5 visits                          [facility] Adult PT Treatment/Exercise - 02/29/20 0001      Ambulation/Gait   Ambulation/Gait Yes    Ambulation/Gait Assistance 7: Independent    Ambulation Distance (Feet) 25 Feet   6 trials    Assistive device None    Gait Pattern Step-through pattern;Decreased arm swing - right;Decreased arm swing - left;Decreased stance time - right;Decreased weight shift to right;Right flexed knee in stance;Antalgic;Wide base of support    Gait Comments cues for Rt  hip ext, even weight shift      Lumbar Exercises: Stretches   Passive Hamstring Stretch Right;Left;2 reps;30 seconds   hooklying with strap   Hip Flexor Stretch Right;3 reps;20 seconds   seated x 1, standing x 2    Standing Extension 5 seconds;3 reps    Prone on Elbows Stretch 3 reps;10 seconds    Prone on Elbows Stretch Limitations tolerated better than prone press up.     ITB Stretch Right;2 reps;Left;1 rep;30 seconds    Piriformis Stretch Right;3 reps;30 seconds   Travell and fig 4   Piriformis Stretch Limitations and 1 rep of mod pigeon pose x 45 sec on RLE       Lumbar Exercises: Aerobic   Tread Mill 1.8 mph x 5 min for warm up.       Lumbar Exercises: Supine   Clam 10 reps   with TA engaged, single knee fallouts.    Bridge 10 reps;3 seconds      Modalities   Modalities --   deferred; will use TENS at home.      Manual Therapy   Manual Therapy Muscle Energy Technique    Muscle Energy Technique MET to correct Rt ant rotated  inominate, MET to correct Rt sacral torsion.             PT Long Term Goals - 02/23/20 1729      PT LONG TERM GOAL #1   Title Decrease LB and Rt hip pain allowing patient to participate in rehab exercises and return to normal functional activities    Time 6    Period Weeks    Status New    Target Date 04/05/20      PT LONG TERM GOAL #2   Title Improve trunk and LE ROM/mobiilty with patient to demonstrate functional and painfree AROM    Time 6    Period Weeks    Status New    Target Date 04/05/20      PT LONG TERM GOAL #3   Title Improve core strength and stability allowing patient to walk for at least 20 min; return to consistent gym program    Time 6    Period Weeks    Status New    Target Date 04/05/20      PT LONG TERM GOAL #4   Title Independent in HEP    Time 6    Period Weeks    Status New    Target Date 04/05/20      PT LONG TERM GOAL #5   Title Improve FOTO to </= 36% limitation    Time 6    Period Weeks    Status New    Target Date 04/05/20                 Plan - 02/29/20 1037    Clinical Impression Statement Pt observed with antalgic gait pattern, with Rt leg staying in neutral and avoiding hip ext with toe off; compensation at knee for stance to toe off.  Slight improvement with cues for increased stride and hip ext on RLE. Pelvis asymmetry noted; Rt ant rotated ilium, Rt sacral torsion; improved with MET corrections.  Pt reported decrease of pain by 2 pt at end of session. Goals are ongoing.    Stability/Clinical Decision Making Stable/Uncomplicated    Rehab Potential Good    PT Frequency 2x / week    PT Duration 6 weeks    PT Treatment/Interventions Patient/family education;ADLs/Self Care Home Management;Aquatic  Therapy;Cryotherapy;Electrical Stimulation;Iontophoresis 4mg /ml Dexamethasone;Moist Heat;Ultrasound;Gait training;Stair training;Functional mobility training;Therapeutic activities;Therapeutic exercise;Balance training;Neuromuscular  re-education;Manual techniques;Dry needling;Taping    PT Next Visit Plan further assessment of sacral asymmetries; assess response to initial exercise program; continue back care education (transfers/transitional movements/sitting for work); ergonomics; manual work vs DN Lt psoas, Rt lumbar to posterior hip(QL/lats/lumbar paraspinals/piriformis).    PT Home Exercise Plan [id number]    Consulted and Agree with Plan of Care Patient           Patient will benefit from skilled therapeutic intervention in order to improve the following deficits and impairments:  Abnormal gait, Difficulty walking, Decreased activity tolerance, Pain, Decreased balance, Hypomobility, Impaired flexibility, Improper body mechanics, Decreased mobility, Decreased strength, Postural dysfunction  Visit Diagnosis: Pain in right hip  Acute left-sided low back pain without sciatica  Other symptoms and signs involving the musculoskeletal system  Abnormal posture     Problem List Patient Active Problem List   Diagnosis Date Noted  . Vasomotor rhinitis 11/08/2019  . Melanoma in situ of back ([place]) 06/07/2019  . Epidermal inclusion cyst 06/02/2019  . Melanoma of skin ([place]) 10/26/2018  . Bilateral carpal tunnel syndrome 07/08/2018  . ED (erectile dysfunction) 03/25/2018  . Lumbar radiculopathy 03/25/2018  . HLD (hyperlipidemia) 03/25/2018  . Pars defect of lumbar spine 03/11/2018  . Anxiety 12/23/2017  . Elevated hemoglobin ([place]) 01/26/2017  . Abnormal EKG 10/15/2016  . Low testosterone in male 10/15/2016  . OSA (obstructive sleep apnea) 09/11/2016  . Libido, decreased 09/11/2016  . Essential (primary) hypertension    Kerin Perna, PTA 02/29/20 2:23 PM  [place], [place], [address] Phone: [phone]   Fax:  [phone]  Name: Glen Chambers MRN: [id number] Date of Birth: 1960/10/10

## 2020-03-02 ENCOUNTER — Other Ambulatory Visit: Payer: Self-pay

## 2020-03-02 ENCOUNTER — Ambulatory Visit (INDEPENDENT_AMBULATORY_CARE_PROVIDER_SITE_OTHER): Payer: 59 | Admitting: Rehabilitative and Restorative Service Providers"

## 2020-03-02 ENCOUNTER — Encounter: Payer: Self-pay | Admitting: Rehabilitative and Restorative Service Providers"

## 2020-03-02 DIAGNOSIS — R29898 Other symptoms and signs involving the musculoskeletal system: Secondary | ICD-10-CM | POA: Diagnosis not present

## 2020-03-02 DIAGNOSIS — R293 Abnormal posture: Secondary | ICD-10-CM

## 2020-03-02 DIAGNOSIS — M25551 Pain in right hip: Secondary | ICD-10-CM

## 2020-03-02 DIAGNOSIS — M545 Low back pain, unspecified: Secondary | ICD-10-CM

## 2020-03-02 NOTE — Therapy (Addendum)
[place], [place], [address] Phone: [phone]   Fax:  [phone]  Physical Therapy Treatment  Patient Details  Name: Glen Chambers MRN: [id number] Date of Birth: 11-13-1960 Referring Provider (PT): Dr Lynne Leader    Encounter Date: 03/02/2020   PT End of Session - 03/02/20 1148    Visit Number 3    Number of Visits 12    Date for PT Re-Evaluation 04/05/20    PT Start Time 1147    PT Stop Time 1240    PT Time Calculation (min) 53 min    Activity Tolerance Patient tolerated treatment well           Past Medical History:  Diagnosis Date  . Hypertension   . Median nerve injury   . Melanoma of skin ([place]) 10/26/2018   Skin biopsy dermatology May 2020  . OSA (obstructive sleep apnea) 09/11/2016   Uses CPAP every night   . Ulnar nerve damage, right, initial encounter     Past Surgical History:  Procedure Laterality Date  . CARPAL TUNNEL RELEASE    . CARPAL TUNNEL WITH CUBITAL TUNNEL    . HERNIA REPAIR     left  and right  . INCISION AND DRAINAGE Left 09/22/2018   Procedure: INCISION AND DRAINAGE LEFT ELBOW SEROMA;  Surgeon: Charlotte Crumb, MD;  Location: [facility];  Service: Orthopedics;  Laterality: Left;    There were no vitals filed for this visit.   Subjective Assessment - 03/02/20 1149    Subjective Moving better but just can't get past that pain. Improved pain and movement with DN/manual work    Currently in Pain? Yes    Pain Score 4     Pain Location Back    Pain Orientation Lower    Pain Descriptors / Indicators Aching;Tightness    Pain Type Acute pain    Pain Radiating Towards Rt hip                             [facility] Adult PT Treatment/Exercise - 03/02/20 0001      Ambulation/Gait   Pre-Gait Activities standing working on posture and alignment; wt shift side to side and fwd /back; VC/TC for alignment    Gait Comments working on gait  with improved alignment; wt shift with shoulders in improved position avoiding lateral shift       Lumbar Exercises: Stretches   Press Ups 10 reps   2-3 sec hold      Lumbar Exercises: Seated   Other Seated Lumbar Exercises sitting on dynadisc working on ant/post pelvic tilt with no pain; lateral shift with some discomfort and pain       Moist Heat Therapy   Number Minutes Moist Heat 10 Minutes    Moist Heat Location Lumbar Spine;Hip   anterior Rt hip      Manual Therapy   Manual therapy comments skilled palpation to assess response to DN/manual work     Joint Mobilization PA mobs Rt hip pt prone     Soft tissue mobilization deep tissue work anterior hip through psoas/hip flexors; posterior hip through glut min/mod/piriformis     Myofascial Release posterior hip/buttock    Passive ROM Rt LE IR/ER in prone hip extended; knee flexed to 90 deg            Trial of DN Rt psoas; glut min/med/max; piriformis followed by deep tissue work  and myofacial release to Rt anterior/posterior hip  Patient tolerated DN and manual work well with good release of muscular tightness noted following treatment.    Moist heat to conclude treatment       PT Education - 03/02/20 1239    Education Details work on standing alignment; wt shift    Person(s) Educated Patient    Methods Explanation;Demonstration;Tactile cues;Verbal cues    Comprehension Verbalized understanding;Returned demonstration;Verbal cues required;Tactile cues required               PT Long Term Goals - 02/23/20 1729      PT LONG TERM GOAL #1   Title Decrease LB and Rt hip pain allowing patient to participate in rehab exercises and return to normal functional activities    Time 6    Period Weeks    Status New    Target Date 04/05/20      PT LONG TERM GOAL #2   Title Improve trunk and LE ROM/mobiilty with patient to demonstrate functional and painfree AROM    Time 6    Period Weeks    Status New    Target Date 04/05/20       PT LONG TERM GOAL #3   Title Improve core strength and stability allowing patient to walk for at least 20 min; return to consistent gym program    Time 6    Period Weeks    Status New    Target Date 04/05/20      PT LONG TERM GOAL #4   Title Independent in HEP    Time 6    Period Weeks    Status New    Target Date 04/05/20      PT LONG TERM GOAL #5   Title Improve FOTO to </= 36% limitation    Time 6    Period Weeks    Status New    Target Date 04/05/20                 Plan - 03/02/20 1152    Clinical Impression Statement Continued lateral shift. Significant muscular tightness noted through the Rt anterior/posterior hip. Imporved alngnment with decreased pain following DN/manual work/ exercise.    Rehab Potential Good    PT Frequency 2x / week    PT Duration 6 weeks    PT Treatment/Interventions Patient/family education;ADLs/Self Care Home Management;Aquatic Therapy;Cryotherapy;Electrical Stimulation;Iontophoresis 4mg /ml Dexamethasone;Moist Heat;Ultrasound;Gait training;Stair training;Functional mobility training;Therapeutic activities;Therapeutic exercise;Balance training;Neuromuscular re-education;Manual techniques;Dry needling;Taping    PT Next Visit Plan further assessment of sacral asymmetries; assess response to initial exercise program; continue back care education (transfers/transitional movements/sitting for work); ergonomics; manual work vs DN Lt psoas, Rt lumbar to posterior hip(QL/lats/lumbar paraspinals/piriformis).    PT Home Exercise Plan [id number]    Consulted and Agree with Plan of Care Patient           Patient will benefit from skilled therapeutic intervention in order to improve the following deficits and impairments:     Visit Diagnosis: Pain in right hip  Acute left-sided low back pain without sciatica  Other symptoms and signs involving the musculoskeletal system  Abnormal posture     Problem List Patient Active Problem List    Diagnosis Date Noted  . Vasomotor rhinitis 11/08/2019  . Melanoma in situ of back ([place]) 06/07/2019  . Epidermal inclusion cyst 06/02/2019  . Melanoma of skin ([place]) 10/26/2018  . Bilateral carpal tunnel syndrome 07/08/2018  . ED (erectile dysfunction) 03/25/2018  . Lumbar radiculopathy 03/25/2018  .  HLD (hyperlipidemia) 03/25/2018  . Pars defect of lumbar spine 03/11/2018  . Anxiety 12/23/2017  . Elevated hemoglobin ([place]) 01/26/2017  . Abnormal EKG 10/15/2016  . Low testosterone in male 10/15/2016  . OSA (obstructive sleep apnea) 09/11/2016  . Libido, decreased 09/11/2016  . Essential (primary) hypertension     Aiysha Jillson Nilda Simmer PT, MPH  03/02/2020, 12:43 PM  [facility] [place], [place], [address] Phone: [phone]   Fax:  [phone]  Name: Bliss Tsang MRN: [id number] Date of Birth: 11/09/1960

## 2020-03-07 ENCOUNTER — Other Ambulatory Visit: Payer: Self-pay

## 2020-03-07 ENCOUNTER — Ambulatory Visit (INDEPENDENT_AMBULATORY_CARE_PROVIDER_SITE_OTHER): Payer: 59 | Admitting: Rehabilitative and Restorative Service Providers"

## 2020-03-07 ENCOUNTER — Encounter: Payer: Self-pay | Admitting: Rehabilitative and Restorative Service Providers"

## 2020-03-07 DIAGNOSIS — R29898 Other symptoms and signs involving the musculoskeletal system: Secondary | ICD-10-CM

## 2020-03-07 DIAGNOSIS — M545 Low back pain, unspecified: Secondary | ICD-10-CM

## 2020-03-07 DIAGNOSIS — R293 Abnormal posture: Secondary | ICD-10-CM

## 2020-03-07 DIAGNOSIS — M25551 Pain in right hip: Secondary | ICD-10-CM | POA: Diagnosis not present

## 2020-03-07 NOTE — Therapy (Signed)
[place], [place], [address] Phone: [phone]   Fax:  [phone]  Physical Therapy Treatment  Patient Details  Name: Glen Chambers MRN: [id number] Date of Birth: [birth year] Referring Provider (PT): Dr Lynne Leader    Encounter Date: 03/07/2020   PT End of Session - 03/07/20 1104    Visit Number 4    Number of Visits 12    Date for PT Re-Evaluation 04/05/20    PT Start Time 1103    PT Stop Time 1152    PT Time Calculation (min) 49 min    Activity Tolerance Patient tolerated treatment well           Past Medical History:  Diagnosis Date   Hypertension    Median nerve injury    Melanoma of skin ([place]) 10/26/2018   Skin biopsy dermatology May 2020   OSA (obstructive sleep apnea) 09/11/2016   Uses CPAP every night    Ulnar nerve damage, right, initial encounter     Past Surgical History:  Procedure Laterality Date   CARPAL TUNNEL RELEASE     CARPAL TUNNEL WITH CUBITAL [place]     left  and right   INCISION AND DRAINAGE Left 09/22/2018   Procedure: INCISION AND DRAINAGE LEFT ELBOW SEROMA;  Surgeon: Charlotte Crumb, MD;  Location: [facility];  Service: Orthopedics;  Laterality: Left;    There were no vitals filed for this visit.   Subjective Assessment - 03/07/20 1106    Subjective Patient [place] to the [employer] game Saturday - a lot of sitting - pain continues. Best thing is the ball on the wall. Just seems stuck. Working on exercises and has taken it easy the past two days.    Currently in Pain? Yes    Pain Score 4    sharp pain in the Rt hip with wt shift to the Rt   Pain Location Back    Pain Orientation Lower    Pain Descriptors / Indicators Aching;Tightness                             [facility] Adult PT Treatment/Exercise - 03/07/20 0001      Therapeutic Activites    Therapeutic Activities --   working on posture and  alignment; wt shift; gait training      Neuro Re-ed    Neuro Re-ed Details  myofacial ball release work prone Lt psoas; supine and standing Rt posterior hip       Lumbar Exercises: Standing   Other Standing Lumbar Exercises captain morgan Rt hip at wall 10 sec hold x 5 reps x 5 sets during the course of treatment       Lumbar Exercises: Quadruped   Madcat/Old Horse 10 reps   VC/TC for correct technique      Moist Heat Therapy   Number Minutes Moist Heat 10 Minutes    Moist Heat Location Lumbar Spine;Hip   anterior Rt hip      Manual Therapy   Manual therapy comments skilled palpation to assess response to DN/manual work     Joint Mobilization PA mobs Rt hip pt prone     Soft tissue mobilization deep tissue work  QL/lumbar spine; posterior hip through glut min/mod/piriformis     Myofascial Release posterior hip/buttock    Passive ROM Rt LE IR/ER in prone hip extended; knee flexed to 90 deg  Trigger Point Dry Needling - 03/07/20 0001    Consent Given? Yes    Education Handout Provided Previously provided    Dry Needling Comments bilat Lumbar; Rt posterior hip     Gluteus Minimus Response Palpable increased muscle length    Gluteus Medius Response Palpable increased muscle length    Gluteus Maximus Response Palpable increased muscle length    Piriformis Response Palpable increased muscle length    Quadratus Lumborum Response Palpable increased muscle length                PT Education - 03/07/20 1148    Education Details HEP wt shift    Person(s) Educated Patient    Methods Explanation;Demonstration;Tactile cues;Verbal cues;Handout    Comprehension Verbalized understanding;Returned demonstration;Verbal cues required;Tactile cues required               PT Long Term Goals - 02/23/20 1729      PT LONG TERM GOAL #1   Title Decrease LB and Rt hip pain allowing patient to participate in rehab exercises and return to normal functional activities    Time 6     Period Weeks    Status New    Target Date 04/05/20      PT LONG TERM GOAL #2   Title Improve trunk and LE ROM/mobiilty with patient to demonstrate functional and painfree AROM    Time 6    Period Weeks    Status New    Target Date 04/05/20      PT LONG TERM GOAL #3   Title Improve core strength and stability allowing patient to walk for at least 20 min; return to consistent gym program    Time 6    Period Weeks    Status New    Target Date 04/05/20      PT LONG TERM GOAL #4   Title Independent in HEP    Time 6    Period Weeks    Status New    Target Date 04/05/20      PT LONG TERM GOAL #5   Title Improve FOTO to </= 36% limitation    Time 6    Period Weeks    Status New    Target Date 04/05/20                 Plan - 03/07/20 1136    Clinical Impression Statement Continued lateral shift and pain with correction. Muscular tightness bilat lumbar through the QL/lumbar paraspinals; Rt posterior hip/psoas. Good response to DN and manual work. Improving postural alignment in standing with decreasing pain    Rehab Potential Good    PT Duration 6 weeks    PT Treatment/Interventions Patient/family education;ADLs/Self Care Home Management;Aquatic Therapy;Cryotherapy;Electrical Stimulation;Iontophoresis 4mg /ml Dexamethasone;Moist Heat;Ultrasound;Gait training;Stair training;Functional mobility training;Therapeutic activities;Therapeutic exercise;Balance training;Neuromuscular re-education;Manual techniques;Dry needling;Taping    PT Next Visit Plan further assessment of sacral asymmetries; assess response exercise program; continue back care education (transfers/transitional movements/sitting for work); ergonomics; manual work vs DN Lt psoas, Rt lumbar to posterior hip(QL/lats/lumbar paraspinals/piriformis).    PT Home Exercise Plan [email or web address]    Consulted and Agree with Plan of Care Patient           Patient will benefit from skilled therapeutic intervention in order to  improve the following deficits and impairments:     Visit Diagnosis: Pain in right hip  Acute left-sided low back pain without sciatica  Other symptoms and signs involving the musculoskeletal system  Abnormal posture  Problem List Patient Active Problem List   Diagnosis Date Noted   Vasomotor rhinitis 11/08/2019   Melanoma in situ of back ([place]) 06/07/2019   Epidermal inclusion cyst 06/02/2019   Melanoma of skin ([place]) 10/26/2018   Bilateral carpal tunnel syndrome 07/08/2018   ED (erectile dysfunction) 03/25/2018   Lumbar radiculopathy 03/25/2018   HLD (hyperlipidemia) 03/25/2018   Pars defect of lumbar spine 03/11/2018   Anxiety 12/23/2017   Elevated hemoglobin ([facility]) 01/26/2017   Abnormal EKG 10/15/2016   Low testosterone in male 10/15/2016   OSA (obstructive sleep apnea) 09/11/2016   Libido, decreased 09/11/2016   Essential (primary) hypertension     Iran Rowe Nilda Simmer PT, MPH  03/07/2020, 12:55 PM  [facility] [place], [place], [address] Phone: [phone]   Fax:  [phone]  Name: Glen Chambers MRN: [id number] Date of Birth: [birth year]

## 2020-03-07 NOTE — Patient Instructions (Signed)
Access Code: [email or web address]: [email or web address]: 09/22/2021Prepared by: Nathanal Hermiz HoltExercises  Supine Piriformis Stretch with Leg Straight - 2 x daily - 7 x weekly - 1 sets - 3 reps - 30 sec hold  Bent Knee Fallouts - 2 x daily - 7 x weekly - 1-2 sets - 10 reps - 2-3 sec hold  Standing Lumbar Extension - 2 x daily - 7 x weekly - 1 sets - 2-3 reps - 2-3 sec hold  Prone Press Up on Elbows - 2 x daily - 7 x weekly - 1 sets - 3 reps - 10 sec hold  Seated Table Piriformis Stretch - 2 x daily - 7 x weekly - 1 sets - 2 reps - 30 seconds hold  Seated Hip Flexor Stretch - 1 x daily - 7 x weekly - 3 sets - 2-3 reps - 30 seconds hold  Glute Med Wall Lean - 2 x daily - 7 x weekly - 1-2 sets - 10 reps - 3-5 sec hold  Cat-Camel - 2 x daily - 7 x weekly - 1 sets - 5-10 reps - 3-5 sec hold

## 2020-03-09 ENCOUNTER — Encounter: Payer: Self-pay | Admitting: Rehabilitative and Restorative Service Providers"

## 2020-03-09 ENCOUNTER — Other Ambulatory Visit: Payer: Self-pay

## 2020-03-09 ENCOUNTER — Ambulatory Visit (INDEPENDENT_AMBULATORY_CARE_PROVIDER_SITE_OTHER): Payer: 59 | Admitting: Rehabilitative and Restorative Service Providers"

## 2020-03-09 DIAGNOSIS — M25551 Pain in right hip: Secondary | ICD-10-CM | POA: Diagnosis not present

## 2020-03-09 DIAGNOSIS — M545 Low back pain, unspecified: Secondary | ICD-10-CM

## 2020-03-09 DIAGNOSIS — R293 Abnormal posture: Secondary | ICD-10-CM | POA: Diagnosis not present

## 2020-03-09 DIAGNOSIS — R29898 Other symptoms and signs involving the musculoskeletal system: Secondary | ICD-10-CM

## 2020-03-09 NOTE — Therapy (Signed)
[place], [place], [address] Phone: [phone]   Fax:  [phone]  Physical Therapy Treatment  Patient Details  Name: Glen Chambers MRN: [id number] Date of Birth: 1960/08/16 Referring Provider (PT): Dr Lynne Leader    Encounter Date: 03/09/2020   PT End of Session - 03/09/20 1154    Visit Number 5    Number of Visits 12    Date for PT Re-Evaluation 04/05/20    PT Start Time 1153    PT Stop Time 1234    PT Time Calculation (min) 41 min    Activity Tolerance Patient tolerated treatment well           Past Medical History:  Diagnosis Date  . Hypertension   . Median nerve injury   . Melanoma of skin ([place]) 10/26/2018   Skin biopsy dermatology May 2020  . OSA (obstructive sleep apnea) 09/11/2016   Uses CPAP every night   . Ulnar nerve damage, right, initial encounter     Past Surgical History:  Procedure Laterality Date  . CARPAL TUNNEL RELEASE    . CARPAL TUNNEL WITH CUBITAL TUNNEL    . HERNIA REPAIR     left  and right  . INCISION AND DRAINAGE Left 09/22/2018   Procedure: INCISION AND DRAINAGE LEFT ELBOW SEROMA;  Surgeon: Charlotte Crumb, MD;  Location: [facility];  Service: Orthopedics;  Laterality: Left;    There were no vitals filed for this visit.   Subjective Assessment - 03/09/20 1154    Subjective Improving since last visit. Can get rid of the pain with stretching. Walked to the mailbox this am and had no pai n- felt "almost normal"    Currently in Pain? Yes    Pain Score 3     Pain Location Back    Pain Orientation Lower    Pain Descriptors / Indicators Aching;Tightness                             [facility] Adult PT Treatment/Exercise - 03/09/20 0001      Therapeutic Activites    Therapeutic Activities --   working on posture and alignment; wt shift; gait training      Neuro Re-ed    Neuro Re-ed Details  myofacial ball release work prone Lt  psoas; supine and standing Rt posterior hip       Lumbar Exercises: Standing   Other Standing Lumbar Exercises captain morgan Rt hip at wall 10 sec hold x 5 reps x 5 sets during the course of treatment       Lumbar Exercises: Seated   Other Seated Lumbar Exercises sitting with and without dynadisc working on ant/post pelvic tilt with no pain - incresed hold time to 10-15 sec       Lumbar Exercises: Quadruped   Madcat/Old Horse 10 reps   VC/TC for correct technique repeated post spinal mobs      Manual Therapy   Manual therapy comments skilled palpation to assess response to DN/manual work     Joint Mobilization PA mobs lumbar spine Grade III/IV;  Rt hip pt prone     Soft tissue mobilization deep tissue work  QL/lumbar spine; posterior hip through glut min/mod/piriformis     Myofascial Release posterior hip/buttock    Passive ROM Rt LE IR/ER in prone hip extended; knee flexed to 90 deg     Muscle Energy Technique resisted rotation in  hip extension knee flexion                  PT Education - 03/09/20 1224    Education Details HEP    Person(s) Educated Patient    Methods Explanation;Demonstration;Tactile cues;Verbal cues;Handout    Comprehension Verbalized understanding;Returned demonstration;Verbal cues required;Tactile cues required               PT Long Term Goals - 02/23/20 1729      PT LONG TERM GOAL #1   Title Decrease LB and Rt hip pain allowing patient to participate in rehab exercises and return to normal functional activities    Time 6    Period Weeks    Status New    Target Date 04/05/20      PT LONG TERM GOAL #2   Title Improve trunk and LE ROM/mobiilty with patient to demonstrate functional and painfree AROM    Time 6    Period Weeks    Status New    Target Date 04/05/20      PT LONG TERM GOAL #3   Title Improve core strength and stability allowing patient to walk for at least 20 min; return to consistent gym program    Time 6    Period Weeks      Status New    Target Date 04/05/20      PT LONG TERM GOAL #4   Title Independent in HEP    Time 6    Period Weeks    Status New    Target Date 04/05/20      PT LONG TERM GOAL #5   Title Improve FOTO to </= 36% limitation    Time 6    Period Weeks    Status New    Target Date 04/05/20                 Plan - 03/09/20 1203    Clinical Impression Statement Good response to DN, manual work and exercise. Working on segmental mobility through the lumbar spine. Improved lateral shift and gait pattern. Progressing toward stated goals of therapy.    Rehab Potential Good    PT Frequency 2x / week    PT Duration 6 weeks    PT Treatment/Interventions Patient/family education;ADLs/Self Care Home Management;Aquatic Therapy;Cryotherapy;Electrical Stimulation;Iontophoresis 4mg /ml Dexamethasone;Moist Heat;Ultrasound;Gait training;Stair training;Functional mobility training;Therapeutic activities;Therapeutic exercise;Balance training;Neuromuscular re-education;Manual techniques;Dry needling;Taping    PT Next Visit Plan further assessment of sacral asymmetries as needed; continue back care education (transfers/transitional movements/sitting for work); ergonomics; manual work vs DN bilat lumbar to posterior hip(QL/lats/lumbar paraspinals/piriformis); continue work on Photographer    PT [place] and Agree with Plan of Care Patient           Patient will benefit from skilled therapeutic intervention in order to improve the following deficits and impairments:     Visit Diagnosis: Pain in right hip  Acute left-sided low back pain without sciatica  Other symptoms and signs involving the musculoskeletal system  Abnormal posture     Problem List Patient Active Problem List   Diagnosis Date Noted  . Vasomotor rhinitis 11/08/2019  . Melanoma in situ of back ([place]) 06/07/2019  . Epidermal inclusion cyst 06/02/2019  . Melanoma of skin ([place])  10/26/2018  . Bilateral carpal tunnel syndrome 07/08/2018  . ED (erectile dysfunction) 03/25/2018  . Lumbar radiculopathy 03/25/2018  . HLD (hyperlipidemia) 03/25/2018  . Pars defect of lumbar spine 03/11/2018  . Anxiety 12/23/2017  .  Elevated hemoglobin ([place]) 01/26/2017  . Abnormal EKG 10/15/2016  . Low testosterone in male 10/15/2016  . OSA (obstructive sleep apnea) 09/11/2016  . Libido, decreased 09/11/2016  . Essential (primary) hypertension     Dorma Altman Nilda Simmer PT, MPH  03/09/2020, 12:54 PM  [facility] [place], [place], [address] Phone: [phone]   Fax:  [phone]  Name: Kyre Jeffries MRN: [id number] Date of Birth: [birth year]

## 2020-03-09 NOTE — Patient Instructions (Signed)
Access Code: [email or web address]: [email or web address]: 09/24/2021Prepared by: Ryonna Cimini HoltExercises  Supine Piriformis Stretch with Leg Straight - 2 x daily - 7 x weekly - 1 sets - 3 reps - 30 sec hold  Bent Knee Fallouts - 2 x daily - 7 x weekly - 1-2 sets - 10 reps - 2-3 sec hold  Standing Lumbar Extension - 2 x daily - 7 x weekly - 1 sets - 2-3 reps - 2-3 sec hold  Prone Press Up on Elbows - 2 x daily - 7 x weekly - 1 sets - 3 reps - 10 sec hold  Seated Table Piriformis Stretch - 2 x daily - 7 x weekly - 1 sets - 2 reps - 30 seconds hold  Seated Hip Flexor Stretch - 1 x daily - 7 x weekly - 3 sets - 2-3 reps - 30 seconds hold  Glute Med Wall Lean - 2 x daily - 7 x weekly - 1-2 sets - 10 reps - 3-5 sec hold  Cat-Camel - 2 x daily - 7 x weekly - 1 sets - 5-10 reps - 3-5 sec hold  Seated Anterior Pelvic Tilt - 2 x daily - 7 x weekly - 1 sets - 10 reps - 2-3 sec hold

## 2020-03-19 ENCOUNTER — Other Ambulatory Visit: Payer: Self-pay

## 2020-03-19 ENCOUNTER — Encounter: Payer: Self-pay | Admitting: Rehabilitative and Restorative Service Providers"

## 2020-03-19 ENCOUNTER — Ambulatory Visit (INDEPENDENT_AMBULATORY_CARE_PROVIDER_SITE_OTHER): Payer: 59 | Admitting: Rehabilitative and Restorative Service Providers"

## 2020-03-19 DIAGNOSIS — M25551 Pain in right hip: Secondary | ICD-10-CM

## 2020-03-19 DIAGNOSIS — M545 Low back pain, unspecified: Secondary | ICD-10-CM | POA: Diagnosis not present

## 2020-03-19 DIAGNOSIS — R293 Abnormal posture: Secondary | ICD-10-CM | POA: Diagnosis not present

## 2020-03-19 DIAGNOSIS — R29898 Other symptoms and signs involving the musculoskeletal system: Secondary | ICD-10-CM

## 2020-03-19 NOTE — Therapy (Signed)
[place], [place], [address] Phone: [phone]   Fax:  [phone]  Physical Therapy Treatment  Patient Details  Name: Glen Chambers MRN: [id number] Date of Birth: [birth year] Referring Provider (PT): Dr Lynne Leader    Encounter Date: 03/19/2020   PT End of Session - 03/19/20 1022    Visit Number 6    Number of Visits 12    Date for PT Re-Evaluation 04/05/20    PT Start Time 1019    PT Stop Time 1102    PT Time Calculation (min) 43 min    Activity Tolerance Patient tolerated treatment well           Past Medical History:  Diagnosis Date  . Hypertension   . Median nerve injury   . Melanoma of skin ([place]) 10/26/2018   Skin biopsy dermatology May 2020  . OSA (obstructive sleep apnea) 09/11/2016   Uses CPAP every night   . Ulnar nerve damage, right, initial encounter     Past Surgical History:  Procedure Laterality Date  . CARPAL TUNNEL RELEASE    . CARPAL TUNNEL WITH CUBITAL TUNNEL    . HERNIA REPAIR     left  and right  . INCISION AND DRAINAGE Left 09/22/2018   Procedure: INCISION AND DRAINAGE LEFT ELBOW SEROMA;  Surgeon: Charlotte Crumb, MD;  Location: [facility];  Service: Orthopedics;  Laterality: Left;    There were no vitals filed for this visit.       [facility] PT Assessment - 03/19/20 0001      Assessment   Medical Diagnosis Rt hip pain     Referring Provider (PT) Dr Lynne Leader     Onset Date/Surgical Date 01/03/20    Hand Dominance Right    Next MD Visit PRN    Prior Therapy chiropractic care 5 visits       AROM   Lumbar Flexion 80% pulling and pain LB to Rt hip    Lumbar Extension 60% no pain     Lumbar - Right Side Bend 80% no pain     Lumbar - Left Side Bend 70% pressure in the LB    Lumbar - Right Rotation 60%     Lumbar - Left Rotation 55%      Strength   Overall Strength Comments WFL's bilat LE's       Flexibility   Hamstrings tight Lt      Quadriceps WFL's bilat     ITB tight Rt     Piriformis tight Rt       Palpation   Spinal mobility hypomobile lumbar spine with PA and Rt lateral mobs Grade II/III    Palpation comment tightness Lt psoas; Rt QL/lats/lumbar paraspinals; piriformis; gluts                          [facility] Adult PT Treatment/Exercise - 03/19/20 0001      Ambulation/Gait   Gait Comments working on gait with improved alignment; wt shift with shoulders in improved position avoiding lateral shift  VC/TC for protraction of Rt hip/pelvis       Lumbar Exercises: Stretches   Other Lumbar Stretch Exercise nerve glide pt supine hip to 90 deg flex/ext knee x 10;  same position knee partially extended ankle DF/PF x 10; repeated knee flex and ext x 10       Lumbar Exercises: Standing   Other Standing Lumbar Exercises captain  morgan Rt hip at wall 10 sec hold x 5 reps x 5 sets during the course of treatment     Other Standing Lumbar Exercises hips to wall rolling down one vetebrae at a time 2-5 sec hold x 10 reps       Lumbar Exercises: Quadruped   Madcat/Old Horse 10 reps   VC/TC for correct technique repeated post spinal mobs    Madcat/Old Horse Limitations added resistance with blue TB for cat cow                   PT Education - 03/19/20 1057    Education Details HEP    Person(s) Educated Patient    Methods Explanation;Demonstration;Tactile cues;Verbal cues;Handout    Comprehension Verbalized understanding;Returned demonstration;Verbal cues required;Tactile cues required               PT Long Term Goals - 02/23/20 1729      PT LONG TERM GOAL #1   Title Decrease LB and Rt hip pain allowing patient to participate in rehab exercises and return to normal functional activities    Time 6    Period Weeks    Status New    Target Date 04/05/20      PT LONG TERM GOAL #2   Title Improve trunk and LE ROM/mobiilty with patient to demonstrate functional and painfree AROM    Time 6     Period Weeks    Status New    Target Date 04/05/20      PT LONG TERM GOAL #3   Title Improve core strength and stability allowing patient to walk for at least 20 min; return to consistent gym program    Time 6    Period Weeks    Status New    Target Date 04/05/20      PT LONG TERM GOAL #4   Title Independent in HEP    Time 6    Period Weeks    Status New    Target Date 04/05/20      PT LONG TERM GOAL #5   Title Improve FOTO to </= 36% limitation    Time 6    Period Weeks    Status New    Target Date 04/05/20                 Plan - 03/19/20 1039    Clinical Impression Statement Continued gradual progress. Still has lateral shift trunk to Lt which has improved and corrects with exercises but pt is still unable to maintain correction. Added neural mobilization in standing and supine and sitting. Patient also added resistive TB to cat cow exercise. Worked on gait with VC/TC for protraction of Rt hip. Gradually progressing toward goals of therapy.    Rehab Potential Good    PT Frequency 2x / week    PT Duration 6 weeks    PT Treatment/Interventions Patient/family education;ADLs/Self Care Home Management;Aquatic Therapy;Cryotherapy;Electrical Stimulation;Iontophoresis 4mg /ml Dexamethasone;Moist Heat;Ultrasound;Gait training;Stair training;Functional mobility training;Therapeutic activities;Therapeutic exercise;Balance training;Neuromuscular re-education;Manual techniques;Dry needling;Taping    PT Next Visit Plan further assessment of sacral asymmetries as needed; continue back care education (transfers/transitional movements/sitting for work); ergonomics; manual work vs DN bilat lumbar to posterior hip(QL/lats/lumbar paraspinals/piriformis); continue work on segmental mobility    PT [place] and Agree with Plan of Care Patient           Patient will benefit from skilled therapeutic intervention in order to improve the following deficits and  impairments:     Visit Diagnosis: Pain in right hip  Acute left-sided low back pain without sciatica  Other symptoms and signs involving the musculoskeletal system  Abnormal posture     Problem List Patient Active Problem List   Diagnosis Date Noted  . Vasomotor rhinitis 11/08/2019  . Melanoma in situ of back ([place]) 06/07/2019  . Epidermal inclusion cyst 06/02/2019  . Melanoma of skin ([place]) 10/26/2018  . Bilateral carpal tunnel syndrome 07/08/2018  . ED (erectile dysfunction) 03/25/2018  . Lumbar radiculopathy 03/25/2018  . HLD (hyperlipidemia) 03/25/2018  . Pars defect of lumbar spine 03/11/2018  . Anxiety 12/23/2017  . Elevated hemoglobin ([place]) 01/26/2017  . Abnormal EKG 10/15/2016  . Low testosterone in male 10/15/2016  . OSA (obstructive sleep apnea) 09/11/2016  . Libido, decreased 09/11/2016  . Essential (primary) hypertension     Jaysen Wey Nilda Simmer PT, MPH  03/19/2020, 11:13 AM  [facility] [place], [place], [address] Phone: [phone]   Fax:  [phone]  Name: Vershawn Westrup MRN: [id number] Date of Birth: 1961-03-21

## 2020-03-19 NOTE — Patient Instructions (Addendum)
Standing back to wall heels slightly away from wall  Slowly curl spine down one vertebrae at at time reaching down toward the floor Hold for 5 sec and repeat 5 times  Cat cow with resistive band   Access Code: [email or web address]: [email or web address]: 10/04/2021Prepared by: Peter Daquila HoltExercises  Supine Piriformis Stretch with Leg Straight - 2 x daily - 7 x weekly - 1 sets - 3 reps - 30 sec hold  Bent Knee Fallouts - 2 x daily - 7 x weekly - 1-2 sets - 10 reps - 2-3 sec hold  Standing Lumbar Extension - 2 x daily - 7 x weekly - 1 sets - 2-3 reps - 2-3 sec hold  Prone Press Up on Elbows - 2 x daily - 7 x weekly - 1 sets - 3 reps - 10 sec hold  Seated Table Piriformis Stretch - 2 x daily - 7 x weekly - 1 sets - 2 reps - 30 seconds hold  Seated Hip Flexor Stretch - 1 x daily - 7 x weekly - 3 sets - 2-3 reps - 30 seconds hold  Glute Med Wall Lean - 2 x daily - 7 x weekly - 1-2 sets - 10 reps - 3-5 sec hold  Cat-Camel - 2 x daily - 7 x weekly - 1 sets - 5-10 reps - 3-5 sec hold  Seated Anterior Pelvic Tilt - 2 x daily - 7 x weekly - 1 sets - 10 reps - 2-3 sec hold  Supine Sciatic Nerve Glide - 2 x daily - 7 x weekly - 1 sets - 3 reps - 30 sec hold  Straight Leg Raise Nerve Flossing - 2 x daily - 7 x weekly - 1 sets - 3 reps - 30 sec hold  Seated Slump Nerve Glide - 2 x daily - 7 x weekly - 1 sets - 10 reps - 1-2 sec hold  Supine LE Neural Mobilization - 2 x daily - 7 x weekly - 1 sets - 10 reps - 1-2 sec hold  Straight Leg Raise Nerve Flossing - 2 x daily - 7 x weekly - 1 sets - 3 reps - 30 sec hold

## 2020-03-21 ENCOUNTER — Encounter: Payer: Self-pay | Admitting: Family Medicine

## 2020-03-21 ENCOUNTER — Ambulatory Visit (INDEPENDENT_AMBULATORY_CARE_PROVIDER_SITE_OTHER): Payer: 59 | Admitting: Family Medicine

## 2020-03-21 ENCOUNTER — Other Ambulatory Visit: Payer: Self-pay

## 2020-03-21 VITALS — BP 120/80 | HR 77 | Ht 68.0 in | Wt 225.0 lb

## 2020-03-21 DIAGNOSIS — M7061 Trochanteric bursitis, right hip: Secondary | ICD-10-CM | POA: Diagnosis not present

## 2020-03-21 NOTE — Progress Notes (Signed)
[name], am serving as a Education administrator for Dr. Lynne Leader.  Glen Chambers is a 59 y.o. male who presents to [place] at [facility] today for f/u of LBP and R hip pain.  He was last seen by Dr. Georgina Snell on 02/16/20 and was referred to outpatient PT of which he has completed 6 visits.  Since his last visit, pt reports that he is doing better, but not a lot better. patient states that he is working very hard at his PT and doing all the things but the progress is very slow. Patient feels like he more so learned how work around/with the pain. Patient does state that he can only walk a couple miles before having to start limping again.   He had been seen medical care prior to my visit on September 2.  He been seeing a chiropractor since about August 1 for hip pain and low back pain.   Diagnostic testing: R hip XR- 02/16/20  Pertinent review of systems: No fevers or chills  Relevant historical information: Melanoma last year   Exam:  BP 120/80 (BP Location: Left Arm, Patient Position: Sitting, Cuff Size: Large)   Pulse 77   Ht 5\' 8"  (1.727 m)   Wt 225 lb (102.1 kg)   SpO2 99%   BMI 34.21 kg/m  General: Well Developed, well nourished, and in no acute distress.   MSK: Right hip normal-appearing tender palpation at gluteus medius tendon and mildly greater trochanter.  Decreased hip motion and decreased strength to abduction and external rotation 4/5.    Lab and Radiology Results DG HIP UNILAT WITH PELVIS 2-3 VIEWS RIGHT  Result Date: 02/17/2020 CLINICAL DATA:  Pain EXAM: DG HIP (WITH OR WITHOUT PELVIS) 2-3V RIGHT COMPARISON:  None. FINDINGS: There is no evidence of hip fracture or dislocation. Mild right hip osteoarthrosis including subchondral sclerosis and degenerative spurring. Mild superior joint space loss. Bilateral SI joints and pubic symphysis are approximated. IMPRESSION: Mild right hip osteoarthrosis. No acute osseous abnormality. Electronically Signed   By:  Primitivo Gauze M.D.   On: 02/17/2020 09:24   I, Lynne Leader, personally (independently) visualized and performed the interpretation of the images attached in this note.      Assessment and Plan: 59 y.o. male with right lateral hip pain and weakness.  Gluteus medius tendinopathy and trochanteric bursitis.  Patient is failing conservative management.  His initial medical provider care started early August at around August 1 and first visit with me was 1 month ago on August 2.  At this point he has had x-ray and a 6-week plus trial of conservative management.  Plan for MRI to further characterize cause of pain and for potential injection or surgical planning.  Recheck after MRI.   PDMP not reviewed this encounter. Orders Placed This Encounter  Procedures  . MR HIP RIGHT WO CONTRAST    Standing Status:   Future    Standing Expiration Date:   03/21/2021    Order Specific Question:   What is the patient's sedation requirement?    Answer:   No Sedation    Order Specific Question:   Does the patient have a pacemaker or implanted devices?    Answer:   No    Order Specific Question:   Preferred imaging location?    Answer:   Product/process development scientist (table limit-350lbs)   No orders of the defined types were placed in this encounter.    Discussed warning signs or symptoms. Please see  discharge instructions. Patient expresses understanding.   The above documentation has been reviewed and is accurate and complete Lynne Leader, M.D.

## 2020-03-21 NOTE — Patient Instructions (Signed)
Thank you for coming in today.  Plan for MRI.  Recheck following MRI . Anticipate injection at that visit.

## 2020-03-22 ENCOUNTER — Encounter: Payer: 59 | Admitting: Rehabilitative and Restorative Service Providers"

## 2020-03-23 ENCOUNTER — Encounter: Payer: Self-pay | Admitting: Family Medicine

## 2020-03-23 DIAGNOSIS — M25551 Pain in right hip: Secondary | ICD-10-CM

## 2020-03-27 ENCOUNTER — Encounter: Payer: 59 | Admitting: Rehabilitative and Restorative Service Providers"

## 2020-03-30 ENCOUNTER — Encounter: Payer: 59 | Admitting: Rehabilitative and Restorative Service Providers"

## 2020-03-31 ENCOUNTER — Ambulatory Visit (INDEPENDENT_AMBULATORY_CARE_PROVIDER_SITE_OTHER): Payer: 59

## 2020-03-31 ENCOUNTER — Other Ambulatory Visit: Payer: Self-pay

## 2020-03-31 DIAGNOSIS — S73191D Other sprain of right hip, subsequent encounter: Secondary | ICD-10-CM

## 2020-03-31 DIAGNOSIS — S76301D Unspecified injury of muscle, fascia and tendon of the posterior muscle group at thigh level, right thigh, subsequent encounter: Secondary | ICD-10-CM | POA: Diagnosis not present

## 2020-03-31 DIAGNOSIS — M7061 Trochanteric bursitis, right hip: Secondary | ICD-10-CM

## 2020-03-31 DIAGNOSIS — M1611 Unilateral primary osteoarthritis, right hip: Secondary | ICD-10-CM | POA: Diagnosis not present

## 2020-04-02 NOTE — Progress Notes (Signed)
MRI hip shows moderate to severe arthritis and a torn labrum.  Additional shows a small partial tear of the hamstring tendon origin.  Reasonable to schedule follow-up with me in the near future to go over the results in full detail.

## 2020-04-03 ENCOUNTER — Other Ambulatory Visit: Payer: Self-pay

## 2020-04-03 ENCOUNTER — Ambulatory Visit (INDEPENDENT_AMBULATORY_CARE_PROVIDER_SITE_OTHER): Payer: 59 | Admitting: Family Medicine

## 2020-04-03 ENCOUNTER — Encounter: Payer: Self-pay | Admitting: Family Medicine

## 2020-04-03 ENCOUNTER — Ambulatory Visit: Payer: Self-pay

## 2020-04-03 VITALS — BP 140/80 | HR 76 | Ht 68.0 in | Wt 224.8 lb

## 2020-04-03 DIAGNOSIS — M25551 Pain in right hip: Secondary | ICD-10-CM | POA: Diagnosis not present

## 2020-04-03 NOTE — Progress Notes (Signed)
I, Wendy Poet, LAT, ATC, am serving as scribe for Dr. Lynne Leader.  Glen Chambers is a 59 y.o. male who presents to [place] at [facility] today for f/u of LBP and R hip pain and R hip MRI review.  He was last seen by Dr. Georgina Snell on 03/21/20 and noted some improvement but was still having pain w/ community level ambulation.  He has completed 6 PT sessions.  Since his last visit, pt reports that his R hip is doing a little better.  He con't to have radiating pain into his B thighs, particularly w/ prolonged standing.  He is also getting some intermittent numbness into his L thigh w/ prolonged standing.  Diagnostic imaging: R hip MRI- 03/31/20; R hip XR- 02/16/20  Pertinent review of systems: No fevers or chills  Relevant historical information: Pars defect lumbar spine.  Melanoma 2020.   Exam:  BP 140/80 (BP Location: Right Arm, Patient Position: Sitting, Cuff Size: Large)   Pulse 76   Ht 5\' 8"  (1.727 m)   Wt 224 lb 12.8 oz (102 kg)   SpO2 99%   BMI 34.18 kg/m  General: Well Developed, well nourished, and in no acute distress.   MSK: Right hip normal-appearing not particular tender palpation decreased motion.    Lab and Radiology Results No results found for this or any previous visit (from the past 72 hour(s)). MR HIP RIGHT WO CONTRAST  Result Date: 03/31/2020 CLINICAL DATA:  Chronic right hip pain. EXAM: MR OF THE RIGHT HIP WITHOUT CONTRAST TECHNIQUE: Multiplanar, multisequence MR imaging was performed. No intravenous contrast was administered. COMPARISON:  Right hip x-rays dated February 16, 2020. FINDINGS: Bones: There is no evidence of acute fracture, dislocation or avascular necrosis. No focal bone lesion. The visualized sacroiliac joints and symphysis pubis appear normal. Articular cartilage and labrum Articular cartilage: Extensive partial and full-thickness cartilage loss in the right hip joint prominent subchondral marrow edema and cystic change in the  acetabulum. Small foci of subchondral marrow edema in the right femoral head. Labrum: Degenerated right anterior superior labrum and torn posterosuperior labrum. Joint or bursal effusion Joint effusion: No significant hip joint effusion. Bursae: No focal periarticular fluid collection. Muscles and tendons Muscles and tendons: Small partial tear of the right hamstring tendon origin. The visualized gluteus, iliopsoas, and left hamstring tendons appear normal. No muscle edema or atrophy. Other findings Miscellaneous: The visualized internal pelvic contents appear unremarkable. IMPRESSION: 1. Moderate to severe right hip osteoarthritis. 2. Degenerated right anterior superior labrum and torn posterosuperior labrum. 3. Small partial tear of the right hamstring tendon origin. Electronically Signed   By: Titus Dubin M.D.   On: 03/31/2020 14:59   I, Lynne Leader, personally (independently) visualized and performed the interpretation of the images attached in this note. Dominant finding DJD right hip  Procedure: Real-time Ultrasound Guided Injection of right hip femoral acetabular joint Device: Philips Affiniti 50G Images permanently stored and available for review in PACS Verbal informed consent obtained.  Discussed risks and benefits of procedure. Warned about infection bleeding damage to structures skin hypopigmentation and fat atrophy among others. Patient expresses understanding and agreement Time-out conducted.   Noted no overlying erythema, induration, or other signs of local infection.   Skin prepped in a sterile fashion.   Local anesthesia: Topical Ethyl chloride.   With sterile technique and under real time ultrasound guidance:  40 mg of Kenalog and 4 mL of Marcaine injected into right hip femoral acetabular joint. Fluid seen entering  the joint capsule.   Completed without difficulty   Pain immediately resolved suggesting accurate placement of the medication.   Advised to call if fevers/chills,  erythema, induration, drainage, or persistent bleeding.   Images permanently stored and available for review in the ultrasound unit.  Impression: Technically successful ultrasound guided injection.         Assessment and Plan: 59 y.o. male with right hip pain multifactorial.  Original pain was thought to be hip abductor tendinopathy which likely was the case however this has significantly improved with physical therapy.  He now has a considerable amount of anterior hip and groin discomfort and pain.  Overall this has improved a bit with physical therapy but is still problematic.  MRI showed more significant DJD then was seen on x-ray rated as moderate to severe.  This is also associated with anterior and posterior superior labrum tears.  This I believe is the remaining cause of his pain.  Plan for diagnostic and therapeutic injection today.  He had an excellent response immediately to injection indicating that the primary cause of his pain is intra-articular.  If the hip remains bothersome and he does not have lasting benefit from steroid injection neck step is surgical consultation for total hip replacement.  He is willing to proceed to this in the future if needed.   PDMP not reviewed this encounter. Orders Placed This Encounter  Procedures  . [place] LIMITED JOINT SPACE STRUCTURES LOW RIGHT(NO LINKED CHARGES)    Order Specific Question:   Reason for Exam (SYMPTOM  OR DIAGNOSIS REQUIRED)    Answer:   R hip pain    Order Specific Question:   Preferred imaging location?    Answer:   [place]   No orders of the defined types were placed in this encounter.    Discussed warning signs or symptoms. Please see discharge instructions. Patient expresses understanding.   The above documentation has been reviewed and is accurate and complete Lynne Leader, M.D.

## 2020-04-03 NOTE — Patient Instructions (Addendum)
You had a R hip injection today.  Call or go to the ER if you develop a large red swollen joint with extreme pain or oozing puss.   If not better let me know.  Next step is eval for hip replacement.   Pay attention to how the hip feels while numbed up.

## 2020-04-04 ENCOUNTER — Ambulatory Visit (INDEPENDENT_AMBULATORY_CARE_PROVIDER_SITE_OTHER): Payer: 59 | Admitting: Rehabilitative and Restorative Service Providers"

## 2020-04-04 ENCOUNTER — Encounter: Payer: Self-pay | Admitting: Rehabilitative and Restorative Service Providers"

## 2020-04-04 ENCOUNTER — Other Ambulatory Visit: Payer: Self-pay

## 2020-04-04 DIAGNOSIS — R293 Abnormal posture: Secondary | ICD-10-CM | POA: Diagnosis not present

## 2020-04-04 DIAGNOSIS — M545 Low back pain, unspecified: Secondary | ICD-10-CM | POA: Diagnosis not present

## 2020-04-04 DIAGNOSIS — M25551 Pain in right hip: Secondary | ICD-10-CM | POA: Diagnosis not present

## 2020-04-04 DIAGNOSIS — R29898 Other symptoms and signs involving the musculoskeletal system: Secondary | ICD-10-CM

## 2020-04-04 NOTE — Therapy (Addendum)
[place], [place], [address] Phone: [phone]   Fax:  [phone]  Physical Therapy Treatment and Discharge  Patient Details  Name: Glen Chambers MRN: [id number] Date of Birth: [birth year] Referring Provider (PT): Dr Lynne Leader    Encounter Date: 04/04/2020   PT End of Session - 04/04/20 1156    Visit Number 7    Number of Visits 18    Date for PT Re-Evaluation 05/16/20    PT Start Time 1152    PT Stop Time 1230    PT Time Calculation (min) 38 min    Activity Tolerance Patient tolerated treatment well           Past Medical History:  Diagnosis Date  . Hypertension   . Median nerve injury   . Melanoma of skin ([place]) 10/26/2018   Skin biopsy dermatology May 2020  . OSA (obstructive sleep apnea) 09/11/2016   Uses CPAP every night   . Ulnar nerve damage, right, initial encounter     Past Surgical History:  Procedure Laterality Date  . CARPAL TUNNEL RELEASE    . CARPAL TUNNEL WITH CUBITAL TUNNEL    . HERNIA REPAIR     left  and right  . INCISION AND DRAINAGE Left 09/22/2018   Procedure: INCISION AND DRAINAGE LEFT ELBOW SEROMA;  Surgeon: Charlotte Crumb, MD;  Location: [facility];  Service: Orthopedics;  Laterality: Left;    There were no vitals filed for this visit.   Subjective Assessment - 04/04/20 1156    Subjective Patient reports that he saw Dr Georgina Snell yesterday and received an injection and has had significant improvement in hip pain. May be heading for Rt THA based on MRI results and response to injection.    Currently in Pain? Yes    Pain Score 3     Pain Location Hip    Pain Orientation Right    Pain Descriptors / Indicators Aching;Tightness    Pain Type Acute pain              [facility] PT Assessment - 04/04/20 0001      Assessment   Medical Diagnosis Rt hip pain     Referring Provider (PT) Dr Lynne Leader     Onset Date/Surgical Date 01/03/20    Hand  Dominance Right    Next MD Visit PRN    Prior Therapy chiropractic care 5 visits       Posture/Postural Control   Posture Comments hips shifted to the Lt upper body to right - less than previously observed       AROM   Right/Left Hip --   tight end ranges Rt > Lt    Lumbar Flexion 80% pulling and pain LB to Rt hip    Lumbar Extension 60% no pain     Lumbar - Right Side Bend 80% no pain     Lumbar - Left Side Bend 70% pressure in the LB    Lumbar - Right Rotation 60%     Lumbar - Left Rotation 55%      Flexibility   Hamstrings tight Lt     Quadriceps tight Rt > Lt     ITB tight Rt     Piriformis tight Rt       Palpation   Palpation comment tightness Lt psoas; Rt QL/lats/lumbar paraspinals; piriformis; gluts       Ambulation/Gait   Gait Comments ambulation with continued changes in alignment; wt shift  with shoulders in improved position avoiding lateral shift  VC/TC for protraction of Rt hip/pelvis                          [facility] Adult PT Treatment/Exercise - 04/04/20 0001      Lumbar Exercises: Stretches   Hip Flexor Stretch Right;3 reps;20 seconds   seated and supine Thomas 3 reps [employer] 10 reps    ITB Stretch Right;2 reps;Left;1 rep;30 seconds    Piriformis Stretch Right;3 reps;30 seconds   supine travell    Other Lumbar Stretch Exercise nerve glide pt supine hip to 90 deg flex/ext knee x 10;  same position knee partially extended ankle DF/PF x 10; repeated knee flex and ext x 10       Lumbar Exercises: Standing   Other Standing Lumbar Exercises captain morgan Rt hip at wall 10 sec hold x 5 reps x 5 sets during the course of treatment                   PT Education - 04/04/20 1217    Education Details HEP    Person(s) Educated Patient    Methods Explanation;Demonstration;Tactile cues;Verbal cues;Handout    Comprehension Verbalized understanding;Returned demonstration;Verbal cues required;Tactile cues required               PT  Long Term Goals - 04/04/20 1246      PT LONG TERM GOAL #1   Title Decrease LB and Rt hip pain allowing patient to participate in rehab exercises and return to normal functional activities    Time 6    Period Weeks    Status Revised    Target Date 05/16/20      PT LONG TERM GOAL #2   Title Improve trunk and LE ROM/mobiilty with patient to demonstrate functional and painfree AROM    Time 6    Period Weeks    Status Revised    Target Date 05/16/20      PT LONG TERM GOAL #3   Title Improve core strength and stability allowing patient to walk for at least 20 min; return to consistent gym program    Time 6    Period Weeks    Status Revised    Target Date 05/16/20      PT LONG TERM GOAL #4   Title Independent in HEP    Time 6    Period Weeks    Status Revised    Target Date 05/16/20      PT LONG TERM GOAL #5   Title Improve FOTO to </= 36% limitation    Time 6    Period Weeks    Status Revised    Target Date 05/16/20      Additional Long Term Goals   Additional Long Term Goals Yes      PT LONG TERM GOAL #6   Title Patient independent in aquatic exercise program to promote normal wt shift to Rt and normalize gait    Time 6    Period Weeks    Status New    Target Date 05/16/20                 Plan - 04/04/20 1206    Clinical Impression Statement Results of MRI Rt hip show degenerative changes in the hip and labrum. Patient received injection in Rt hip yesterday which has helped Rt hip pain significantly. He feels he may proceed wtih  Rt THA. Patient presents with persistent lateral shift in standing and with gait. He has muscular tightness through the lumbar spine and Rt > Lt hip. Added stretching exercises specific to hip flexors in preparation for possible THA. Will schedule aquatic therapy session to work on gait and wt shift in unweighted environment as well as LE strengthening.    Rehab Potential Good    PT Frequency 2x / week    PT Duration 6 weeks    PT  Treatment/Interventions Patient/family education;ADLs/Self Care Home Management;Aquatic Therapy;Cryotherapy;Electrical Stimulation;Iontophoresis 4mg /ml Dexamethasone;Moist Heat;Ultrasound;Gait training;Stair training;Functional mobility training;Therapeutic activities;Therapeutic exercise;Balance training;Neuromuscular re-education;Manual techniques;Dry needling;Taping    PT Next Visit Plan pt to continue with HEP adding ex from today; schedule aquatic therapy; continue with land based program as indicated    PT Home Exercise Plan [id number]    Consulted and Agree with Plan of Care Patient           Patient will benefit from skilled therapeutic intervention in order to improve the following deficits and impairments:     Visit Diagnosis: Pain in right hip - Plan: PT plan of care cert/re-cert  Acute left-sided low back pain without sciatica - Plan: PT plan of care cert/re-cert  Other symptoms and signs involving the musculoskeletal system - Plan: PT plan of care cert/re-cert  Abnormal posture - Plan: PT plan of care cert/re-cert     Problem List Patient Active Problem List   Diagnosis Date Noted  . Vasomotor rhinitis 11/08/2019  . Melanoma in situ of back ([place]) 06/07/2019  . Epidermal inclusion cyst 06/02/2019  . Melanoma of skin ([place]) 10/26/2018  . Bilateral carpal tunnel syndrome 07/08/2018  . ED (erectile dysfunction) 03/25/2018  . Lumbar radiculopathy 03/25/2018  . HLD (hyperlipidemia) 03/25/2018  . Pars defect of lumbar spine 03/11/2018  . Anxiety 12/23/2017  . Elevated hemoglobin ([place]) 01/26/2017  . Abnormal EKG 10/15/2016  . Low testosterone in male 10/15/2016  . OSA (obstructive sleep apnea) 09/11/2016  . Libido, decreased 09/11/2016  . Essential (primary) hypertension    PHYSICAL THERAPY DISCHARGE SUMMARY  Visits from Start of Care: 7  Current functional level related to goals / functional outcomes: See above   Remaining deficits: See above   Education /  Equipment: HEP Plan: Patient agrees to discharge.  Patient goals were partially met. Patient is being discharged due to not returning since the last visit.  ?????     DONAWERTH,KAREN, PT  Kha Hari Nilda Simmer PT, MPH  04/04/2020, 12:50 PM  [facility] [place], [place], [address] Phone: [phone]   Fax:  [phone]  Name: Glen Chambers MRN: [id number] Date of Birth: [birth year]

## 2020-04-04 NOTE — Patient Instructions (Addendum)
Aquatic Therapy: What to Expect!  Where:  [place], [place]  [address] [phone]    NOTE: Dennis Bast will receive an automated phone message  reminding you of your appointment and it will say the   appointment is at [facility] in [place]. We are  working to fix this - just know that you will meet [place] at pool!       How to Prepare: Please make sure you drink 8 ounces of water about one hour prior to your pool session. Sign in at the front desk upon your arrival.  Once on the pool deck, your therapist will ask you to sign the Patient  Consent and Assignment of Benefits form. If you have a caregiver, they must attend the entire session with you (unless your primary therapists feels this is not necessary). The caregiver will be responsible for assisting with dressing as well as any toileting needs.  Please arrive IN YOUR SUIT and a few minutes prior to your appointment - this helps to avoid delays in starting your session. Please make sure to attend to any toileting needs prior to entering the pool. You can bring your pool bag with you and place it on bench near our work space.   Your therapist may take your blood pressure prior to, during, and after your session if indicated. We usually try and create a home exercise program based on activities we do in the pool.  Please be thinking about who might be able to assist you in the pool should you want to participate in an aquatic home exercise program at the time of discharge.  Some patients do not want to or do not have the ability to participate in an aquatic home program - this is not a barrier in any way to you participating in aquatic therapy as part of your current therapy plan! About the pool: Entering the pool: Your therapist will assist you; there are multiple ways to enter including stairs with railings, a walk-in ramp, a roll-in chair and a mechanical lift. Your therapist will determine  the most appropriate way for you. Water temperature is usually between 86-87 degrees. There may be other swimmers in the pool at the same time. All sessions are 45 minutes.  Contact Info:  [facility] [facility]  [address] [place], [place] [address]     Please call our [facility] office if you need to cancel or reschedule.   [phone]         Access Code: [email or web address]: [email or web address]: 10/20/2021Prepared by: Frans Valente HoltExercises  Supine Piriformis Stretch with Leg Straight - 2 x daily - 7 x weekly - 1 sets - 3 reps - 30 sec hold  Hooklying Hamstring Stretch with Strap - 2 x daily - 7 x weekly - 1 sets - 3 reps - 30 sec hold  Supine ITB Stretch with Strap - 2 x daily - 7 x weekly - 1 sets - 3 reps - 30 sec hold  Prone Quadriceps Stretch with Strap - 2 x daily - 7 x weekly - 1 sets - 3 reps - 30 sec hold  Prone Press Up - 2 x daily - 7 x weekly - 1 sets - 10 reps - 2-3 sec hold  Gastroc Stretch on Wall - 2 x daily - 7 x weekly - 1 sets - 3 reps - 30 sec hold  Supine Piriformis Stretch with Leg Straight -  2 x daily - 7 x weekly - 1 sets - 3 reps - 30 sec hold  Hip Flexor Stretch at Edge of Bed - 2 x daily - 7 x weekly - 1 sets - 3 reps - 30 sec hold

## 2020-04-13 ENCOUNTER — Ambulatory Visit: Payer: 59 | Admitting: Physical Therapy

## 2020-04-20 ENCOUNTER — Ambulatory Visit: Payer: 59 | Admitting: Physical Therapy

## 2020-04-24 NOTE — Addendum Note (Signed)
Addended by: Gregor Hams on: 04/24/2020 11:42 AM   Modules accepted: Orders

## 2020-04-29 ENCOUNTER — Other Ambulatory Visit: Payer: Self-pay | Admitting: Family Medicine

## 2020-04-30 ENCOUNTER — Other Ambulatory Visit: Payer: Self-pay | Admitting: Family Medicine

## 2020-05-01 DIAGNOSIS — M1611 Unilateral primary osteoarthritis, right hip: Secondary | ICD-10-CM | POA: Insufficient documentation

## 2020-05-11 ENCOUNTER — Other Ambulatory Visit: Payer: Self-pay | Admitting: Family Medicine

## 2020-05-14 NOTE — Telephone Encounter (Signed)
Patient has a new patient appointment with Dr. Luetta Nutting on 08/03/2020

## 2020-06-01 ENCOUNTER — Other Ambulatory Visit: Payer: Self-pay | Admitting: Family Medicine

## 2020-06-01 NOTE — Telephone Encounter (Signed)
Please advise regarding refill requests as these are primary care-type medications.

## 2020-08-03 ENCOUNTER — Ambulatory Visit: Payer: 59 | Admitting: Family Medicine

## 2020-08-06 ENCOUNTER — Ambulatory Visit: Payer: 59 | Admitting: Family Medicine

## 2020-08-07 NOTE — Progress Notes (Signed)
I, Glen Chambers, LAT, ATC acting as a scribe for Lynne Leader, MD.  Glen Chambers is a 60 y.o. male who presents to [place] at [facility] today for L hip pain. Pt was last seen by Dr. Georgina Snell on 04/03/20 and was advised to proceed to surgery for a R total hip replacement, which was done on 06/01/20 by Dr. Tacey Ruiz. Pt report much relief from R hip surgery and is grateful he had it done. Today, pt c/o L hip pain ongoing since Oct 2021. Pt locates pain to posterior aspect of hip that radiates down the lateral hip to the anterior thigh and knee to the anterior medial calf.  He notes occasional weakness with foot dorsiflexion and is occasionally tripping.  Patient has had considerable physical therapy for this already over the last 6 weeks with very little improvement.  Radiates: yes L hip mechanical symptoms: no LE Numbness/tingling: yes LE Weakness: no Aggravates: walking Treatments tried: TENS, IBU, PT   Pertinent review of systems: No fevers or chills  Relevant historical information:  History of pars defect at L5 seen on CT scan of lumbar spine in 2015 History of melanoma   Exam:  Pulse 97   Ht 5\' 8"  (1.727 m)   Wt 229 lb 12.8 oz (104.2 kg)   SpO2 97%   BMI 34.94 kg/m  General: Well Developed, well nourished, and in no acute distress.   MSK: L-spine normal-appearing Nontender midline. Tender palpation left lumbar paraspinal musculature. Decreased lumbar motion. Positive left-sided slump test. Strength intact bilateral lower extremities with exception of the dorsiflexion which is diminished 4+/5.Marland Kitchen  Reflexes intact. Sensation intact.    Lab and Radiology Results  X-ray images L-spine obtained today personally and independently interpreted DDD and facet DJD.  No significant spondylolisthesis.  No lytic or blastic lesions. Await formal radiology review    Assessment and Plan: 59 y.o. male with left lumbar radiculopathy at L4 with associated  weakness.  This is a new issue but may be related to his pars defect that was seen previously on CT scan.  He is already had physical therapy for this with little improvement.  We will proceed more directly to MRI at this point to further characterize cause of pain and weakness and for potential epidural steroid injection.  In addition we will proceed more quickly to MRI given his history of melanoma.  Recheck after MRI. Prednisone and gabapentin.  PDMP not reviewed this encounter. Orders Placed This Encounter  Procedures  . DG Lumbar Spine Complete    Standing Status:   Future    Number of Occurrences:   1    Standing Expiration Date:   08/08/2021    Order Specific Question:   Reason for Exam (SYMPTOM  OR DIAGNOSIS REQUIRED)    Answer:   eval left L4 radicuopathy    Order Specific Question:   Preferred imaging location?    Answer:   [facility]  . MR Lumbar Spine Wo Contrast    Standing Status:   Future    Standing Expiration Date:   08/08/2021    Order Specific Question:   What is the patient's sedation requirement?    Answer:   No Sedation    Order Specific Question:   Does the patient have a pacemaker or implanted devices?    Answer:   No    Order Specific Question:   Preferred imaging location?    Answer:   [facility] development [facility] (table limit-350lbs)  Meds ordered this encounter  Medications  . predniSONE (DELTASONE) 50 MG tablet    Sig: Take 1 tablet (50 mg total) by mouth daily.    Dispense:  5 tablet    Refill:  0  . gabapentin (NEURONTIN) 300 MG capsule    Sig: Take 1 capsule (300 mg total) by mouth 3 (three) times daily as needed.    Dispense:  90 capsule    Refill:  3     Discussed warning signs or symptoms. Please see discharge instructions. Patient expresses understanding.   The above documentation has been reviewed and is accurate and complete Lynne Leader, M.D.

## 2020-08-08 ENCOUNTER — Ambulatory Visit (INDEPENDENT_AMBULATORY_CARE_PROVIDER_SITE_OTHER): Payer: 59

## 2020-08-08 ENCOUNTER — Ambulatory Visit: Payer: 59 | Admitting: Family Medicine

## 2020-08-08 ENCOUNTER — Other Ambulatory Visit: Payer: Self-pay

## 2020-08-08 ENCOUNTER — Ambulatory Visit (INDEPENDENT_AMBULATORY_CARE_PROVIDER_SITE_OTHER): Payer: 59 | Admitting: Family Medicine

## 2020-08-08 VITALS — HR 97 | Ht 68.0 in | Wt 229.8 lb

## 2020-08-08 DIAGNOSIS — M4306 Spondylolysis, lumbar region: Secondary | ICD-10-CM

## 2020-08-08 DIAGNOSIS — M5416 Radiculopathy, lumbar region: Secondary | ICD-10-CM | POA: Diagnosis not present

## 2020-08-08 MED ORDER — GABAPENTIN 300 MG PO CAPS: 300.0000 mg | ORAL_CAPSULE | Freq: Three times a day (TID) | ORAL | 3 refills | Status: DC | PRN

## 2020-08-08 MED ORDER — PREDNISONE 50 MG PO TABS: 50.0000 mg | ORAL_TABLET | Freq: Every day | ORAL | 0 refills | Status: DC

## 2020-08-08 NOTE — Patient Instructions (Signed)
Thank you for coming in today.  Please get an Xray today before you leave  You should hear from MRI scheduling within 1 week. If you do not hear please let me know.   Plan for gabapentin.   Backup course of prednisone.

## 2020-08-10 NOTE — Progress Notes (Signed)
X-ray lumbar spine shows no fractures.  Some arthritis present.  MRI should be helpful.

## 2020-08-13 ENCOUNTER — Other Ambulatory Visit: Payer: Self-pay

## 2020-08-13 ENCOUNTER — Ambulatory Visit (INDEPENDENT_AMBULATORY_CARE_PROVIDER_SITE_OTHER): Payer: 59

## 2020-08-13 DIAGNOSIS — M4306 Spondylolysis, lumbar region: Secondary | ICD-10-CM

## 2020-08-13 DIAGNOSIS — M5416 Radiculopathy, lumbar region: Secondary | ICD-10-CM | POA: Diagnosis not present

## 2020-08-13 MED ORDER — TESTOSTERONE CYPIONATE 200 MG/ML IM SOLN: 150.0000 mg | INTRAMUSCULAR | 2 refills | Status: DC

## 2020-08-14 ENCOUNTER — Telehealth: Payer: Self-pay | Admitting: Family Medicine

## 2020-08-14 DIAGNOSIS — M5416 Radiculopathy, lumbar region: Secondary | ICD-10-CM

## 2020-08-14 NOTE — Telephone Encounter (Signed)
Epidural steroid injection ordered 

## 2020-08-14 NOTE — Progress Notes (Signed)
Lumbar spine MRI shows spinal stenosis. Additionally shows some significant pinched nerves on the left at L2-L3 and L4 and right L4-5. Your main issue is left L4 clinically. I have already ordered an epidural steroid injection. Please call [facility] at [phone] to schedule. If you would like to you can schedule a follow-up appoint with me to go over these results in full detail. Call [facility] imaging to schedule epidural steroid injection.

## 2020-08-15 ENCOUNTER — Ambulatory Visit
Admission: RE | Admit: 2020-08-15 | Discharge: 2020-08-15 | Disposition: A | Discharge status: Home / Self Care | Payer: 59 | Source: Ambulatory Visit | Attending: Family Medicine | Admitting: Family Medicine

## 2020-08-15 ENCOUNTER — Other Ambulatory Visit: Payer: Self-pay

## 2020-08-15 DIAGNOSIS — M5416 Radiculopathy, lumbar region: Secondary | ICD-10-CM

## 2020-08-15 MED ORDER — IOPAMIDOL (ISOVUE-M 200) INJECTION 41%
1.0000 mL | Freq: Once | INTRAMUSCULAR | Status: AC
  Administered 2020-08-15: 1 mL via EPIDURAL

## 2020-08-15 MED ORDER — METHYLPREDNISOLONE ACETATE 40 MG/ML INJ SUSP (RADIOLOG
120.0000 mg | Freq: Once | INTRAMUSCULAR | Status: AC
  Administered 2020-08-15: 120 mg via EPIDURAL

## 2020-08-15 NOTE — Discharge Instructions (Signed)

## 2020-08-16 ENCOUNTER — Ambulatory Visit: Payer: 59 | Admitting: Family Medicine

## 2020-08-20 ENCOUNTER — Encounter: Payer: Self-pay | Admitting: Family Medicine

## 2020-08-20 DIAGNOSIS — M5416 Radiculopathy, lumbar region: Secondary | ICD-10-CM

## 2020-08-22 ENCOUNTER — Encounter: Payer: Self-pay | Admitting: Family Medicine

## 2020-08-22 ENCOUNTER — Other Ambulatory Visit: Payer: Self-pay

## 2020-08-22 ENCOUNTER — Ambulatory Visit (INDEPENDENT_AMBULATORY_CARE_PROVIDER_SITE_OTHER): Payer: 59 | Admitting: Family Medicine

## 2020-08-22 VITALS — BP 137/80 | HR 82 | Temp 97.9°F | Ht 68.0 in | Wt 230.2 lb

## 2020-08-22 DIAGNOSIS — E782 Mixed hyperlipidemia: Secondary | ICD-10-CM | POA: Diagnosis not present

## 2020-08-22 DIAGNOSIS — I10 Essential (primary) hypertension: Secondary | ICD-10-CM

## 2020-08-22 DIAGNOSIS — R7989 Other specified abnormal findings of blood chemistry: Secondary | ICD-10-CM | POA: Diagnosis not present

## 2020-08-22 DIAGNOSIS — N529 Male erectile dysfunction, unspecified: Secondary | ICD-10-CM | POA: Diagnosis not present

## 2020-08-22 MED ORDER — CANDESARTAN CILEXETIL-HCTZ 32-25 MG PO TABS: 1.0000 | ORAL_TABLET | Freq: Every day | ORAL | 3 refills | Status: DC

## 2020-08-22 MED ORDER — ATORVASTATIN CALCIUM 20 MG PO TABS: 20.0000 mg | ORAL_TABLET | Freq: Every day | ORAL | 3 refills | Status: DC

## 2020-08-22 MED ORDER — AMLODIPINE BESYLATE 10 MG PO TABS: 10.0000 mg | ORAL_TABLET | Freq: Every day | ORAL | 3 refills | Status: DC

## 2020-08-22 MED ORDER — SILDENAFIL CITRATE 100 MG PO TABS: 50.0000 mg | ORAL_TABLET | Freq: Every day | ORAL | 11 refills | Status: DC | PRN

## 2020-08-22 NOTE — Assessment & Plan Note (Signed)
Feels good at current dosing of testosterone. Update testosterone, cbc and psa.

## 2020-08-22 NOTE — Assessment & Plan Note (Signed)
Blood pressure is at goal at for age and co-morbidities.  I recommend continaution of current medications.  Will update labs as well.   In addition they were instructed to follow a low sodium diet with regular exercise to help to maintain adequate control of blood pressure.

## 2020-08-22 NOTE — Progress Notes (Signed)
Glen Chambers - 60 y.o. male MRN [id number]  Date of birth: 04/02/1961  Subjective Chief Complaint  Patient presents with  . Hypertension    HPI Glen Chambers is a 60 y.o. male here today for follow up of HTN, low testosterone, and HLD. Recently lost his father, handling fairly well.    HTN is managed with amlodipine and candesartan/hctz.  He is doing well with this.  He denies side effects from medications.  He has not had chest pain, shortness of breath, palpitations, headache or vision changes.    He feels good with current doe of testosterone.  No side effects or issues with injections.    He is tolerating atorvastatin well without myalgias.    ROS:  A comprehensive ROS was completed and negative except as noted per HPI   Allergies  Allergen Reactions  . Lisinopril Cough    Mild cough with lisinopril    Past Medical History:  Diagnosis Date  . Hypertension   . Median nerve injury   . Melanoma of skin ([place]) 10/26/2018   Skin biopsy dermatology May 2020  . OSA (obstructive sleep apnea) 09/11/2016   Uses CPAP every night   . Ulnar nerve damage, right, initial encounter     Past Surgical History:  Procedure Laterality Date  . CARPAL TUNNEL RELEASE    . CARPAL TUNNEL WITH CUBITAL TUNNEL    . HERNIA REPAIR     left  and right  . INCISION AND DRAINAGE Left 09/22/2018   Procedure: INCISION AND DRAINAGE LEFT ELBOW SEROMA;  Surgeon: Charlotte Crumb, MD;  Location: [facility];  Service: Orthopedics;  Laterality: Left;    Social History   Socioeconomic History  . Marital status: Married    Spouse name: Not on file  . Number of children: 0  . Years of education: 47  . Highest education level: Not on file  Occupational History  . Occupation: Technical brewer [employer]  Tobacco Use  . Smoking status: Never Smoker  . Smokeless tobacco: Never Used  Vaping Use  . Vaping Use: Never used  Substance and Sexual Activity  . Alcohol use: Yes    Comment: few beers on  weekend  . Drug use: No  . Sexual activity: Not on file  Other Topics Concern  . Not on file  Social History Narrative   No children   Drinks caffeine   Two story home   Right handed    Social Determinants of Health   Financial Resource Strain: Not on file  Food Insecurity: Not on file  Transportation Needs: Not on file  Physical Activity: Not on file  Stress: Not on file  Social Connections: Not on file    Family History  Problem Relation Age of Onset  . Hypertension Mother   . Heart disease Father   . Hypertension Father     Health Maintenance  Topic Date Due  . Fecal DNA (Cologuard)  Never done  . COVID-19 Vaccine (3 - Pfizer risk 4-dose series) 10/31/2019  . INFLUENZA VACCINE  Never done  . TETANUS/TDAP  10/22/2023  . Hepatitis C Screening  Completed  . HIV Screening  Completed  . HPV VACCINES  Aged Out     ----------------------------------------------------------------------------------------------------------------------------------------------------------------------------------------------------------------- Physical Exam BP 137/80   Pulse 82   Temp 97.9 F (36.6 C)   Ht 5\' 8"  (1.727 m)   Wt 230 lb 3.2 oz (104.4 kg)   SpO2 100%   BMI 35.00 kg/m   Physical Exam Constitutional:  Appearance: Normal appearance.  HENT:     Head: Normocephalic and atraumatic.  Eyes:     General: No scleral icterus. Cardiovascular:     Rate and Rhythm: Normal rate and regular rhythm.  Pulmonary:     Effort: Pulmonary effort is normal.     Breath sounds: Normal breath sounds.  Neurological:     General: No focal deficit present.     Mental Status: He is alert.  Psychiatric:        Mood and Affect: Mood normal.        Behavior: Behavior normal.      ------------------------------------------------------------------------------------------------------------------------------------------------------------------------------------------------------------------- Assessment and Plan  Essential (primary) hypertension Blood pressure is at goal at for age and co-morbidities.  I recommend continaution of current medications.  Will update labs as well.   In addition they were instructed to follow a low sodium diet with regular exercise to help to maintain adequate control of blood pressure.    ED (erectile dysfunction) Doing well with sildenafil as needed.  He may continue this.    HLD (hyperlipidemia) Tolerating atorvastatin well.  Update lipid panel.   Low testosterone in male Feels good at current dosing of testosterone. Update testosterone, cbc and psa.    Meds ordered this encounter  Medications  . amLODipine (NORVASC) 10 MG tablet    Sig: Take 1 tablet (10 mg total) by mouth daily.    Dispense:  90 tablet    Refill:  3  . atorvastatin (LIPITOR) 20 MG tablet    Sig: Take 1 tablet (20 mg total) by mouth daily.    Dispense:  90 tablet    Refill:  3  . Candesartan Cilexetil-HCTZ 32-25 MG TABS    Sig: Take 1 tablet by mouth daily.    Dispense:  90 tablet    Refill:  3  . sildenafil (VIAGRA) 100 MG tablet    Sig: Take 0.5-1 tablets (50-100 mg total) by mouth daily as needed for erectile dysfunction.    Dispense:  30 tablet    Refill:  11    Return in about 6 months (around 02/22/2021) for HTN/Testosterone.    This visit occurred during the SARS-CoV-2 public health emergency.  Safety protocols were in place, including screening questions prior to the visit, additional usage of staff PPE, and extensive cleaning of exam room while observing appropriate contact time as indicated for disinfecting solutions.

## 2020-08-22 NOTE — Patient Instructions (Signed)
Great to see you today! Please continue current medications.  Have labs completed.  See me again in about 6 months.

## 2020-08-22 NOTE — Assessment & Plan Note (Signed)
Tolerating atorvastatin well.  Update lipid panel.  

## 2020-08-22 NOTE — Assessment & Plan Note (Signed)
Doing well with sildenafil as needed.  He may continue this.

## 2020-08-30 ENCOUNTER — Ambulatory Visit
Admission: RE | Admit: 2020-08-30 | Discharge: 2020-08-30 | Disposition: A | Discharge status: Home / Self Care | Payer: 59 | Source: Ambulatory Visit | Attending: Family Medicine | Admitting: Family Medicine

## 2020-08-30 ENCOUNTER — Other Ambulatory Visit: Payer: Self-pay

## 2020-08-30 DIAGNOSIS — M5416 Radiculopathy, lumbar region: Secondary | ICD-10-CM

## 2020-08-30 MED ORDER — METHYLPREDNISOLONE ACETATE 40 MG/ML INJ SUSP (RADIOLOG
120.0000 mg | Freq: Once | INTRAMUSCULAR | Status: AC
  Administered 2020-08-30: 120 mg via EPIDURAL

## 2020-08-30 MED ORDER — IOPAMIDOL (ISOVUE-M 200) INJECTION 41%
1.0000 mL | Freq: Once | INTRAMUSCULAR | Status: AC
  Administered 2020-08-30: 1 mL via EPIDURAL

## 2020-08-30 NOTE — Discharge Instructions (Signed)

## 2020-09-27 ENCOUNTER — Telehealth (INDEPENDENT_AMBULATORY_CARE_PROVIDER_SITE_OTHER): Payer: 59 | Admitting: Family Medicine

## 2020-09-27 ENCOUNTER — Encounter: Payer: Self-pay | Admitting: Family Medicine

## 2020-09-27 DIAGNOSIS — U071 COVID-19: Secondary | ICD-10-CM

## 2020-09-27 NOTE — Progress Notes (Signed)
Virtual Video Visit via MyChart Note  I connected with  Glen Chambers on 09/27/20 at 11:10 AM EDT by the video enabled telemedicine application for MyChart, and verified that I am speaking with the correct person using two identifiers.   I introduced myself as a Designer, jewellery with the practice. We discussed the limitations of evaluation and management by telemedicine and the availability of in person appointments. The patient expressed understanding and agreed to proceed.  Participating parties in this visit include: The patient and the nurse practitioner listed.  The patient is: At home I am: In the office - [facility]  Subjective:    CC:  Chief Complaint  Patient presents with  . URI    HPI: Glen Chambers is a 60 y.o. year old male presenting today via [place] today for COVID positive with symptoms.  On Monday evening, patient began having symptoms including fever/chills, temp 101.4, body aches, congestion, fatigue. He took a home COVID test which was positive (repeat was also positive). He has been taking nose spray and ibuprofen and tylenol with good improvement overall. States he has not had any more fevers and is feeling good overall - continues to have some mild congestion and body aches, but congestion and fatigue are improving. He denies any chest pain, shortness of breath, GI symptoms.   He just wanted to speak with provider today to be sure he was doing everything right to get well as quickly as possible without declining.   Past medical history, Surgical history, Family history not pertinant except as noted below, Social history, Allergies, and medications have been entered into the medical record, reviewed, and corrections made.   Review of Systems:  All review of systems negative except what is listed in the HPI    Objective:    General:  Speaking clearly in complete sentences. Absent shortness of breath noted.   Alert and oriented x3.   Normal  judgment.  Absent acute distress.   Impression and Recommendations:    1. COVID-19 Discussed COVID virus with patient and varying symptoms and progression in different people. He is doing very well overall with mostly mild symptoms at this point. Encouraged him to continue supportive therapy with OTC cold remedies and analgesia. Also recommended Mucinex, Vitamins C, D and zinc, salt water gargles, Flonase, hydration, rest. He is aware of quarantine recommendations. Education sheet attached to AVS in [place]. We discussed monitoring his signs/symptoms and vitals as able to watch for any signs of decline. Educated on signs/symptoms that would require further or urgent evaluation.   Follow-up if symptoms worsen or fail to improve.    I discussed the assessment and treatment plan with the patient. The patient was provided an opportunity to ask questions and all were answered. The patient agreed with the plan and demonstrated an understanding of the instructions.   The patient was advised to call back or seek an in-person evaluation if the symptoms worsen or if the condition fails to improve as anticipated.  I provided 20 minutes of non-face-to-face interaction with this [place] visit including intake, same-day documentation, and chart review.   Terrilyn Saver, NP

## 2020-09-27 NOTE — Patient Instructions (Addendum)
Over the counter medications that may be helpful for symptoms:  . Guaifenesin 1200 mg extended release tabs twice daily, with plenty of water o For cough and congestion o Brand name: Mucinex   . Pseudoephedrine 30 mg, one or two tabs every 4 to 6 hours o For sinus congestion o Brand name: Sudafed o You must get this from the pharmacy counter.  . Oxymetazoline nasal spray each morning, one spray in each nostril, for NO MORE THAN 3 days  o For nasal and sinus congestion o Brand name: Afrin . Saline nasal spray or Saline Nasal Irrigation 3-5 times a day o For nasal and sinus congestion o Brand names: [place] or AYR . Fluticasone nasal spray, one spray in each nostril, each morning (after oxymetazoline and saline, if used) o For nasal and sinus congestion o Brand name: Flonase . Warm salt water gargles  o For sore throat o Every few hours as needed . Alternate ibuprofen 400-600 mg and acetaminophen 1000 mg every 4-6 hours o For fever, body aches, headache o Brand names: Motrin or Advil and Tylenol . Dextromethorphan 12-hour cough version 30 mg every 12 hours  o For cough o Brand name: Delsym Stop all other cold medications for now (Nyquil, Dayquil, Tylenol Cold, Theraflu, etc) and other non-prescription cough/cold preparations. Many of these have the same ingredients listed above and could cause an overdose of medication.   Herbal treatments that have been shown to be helpful in some patients include: Vitamin C 1000mg  per day Vitamin D 4000iU per day Zinc 100mg  per day Quercetin 25-500mg  twice a day Melatonin 5-10mg  at bedtime  General Instructions . Allow your body to rest . Drink PLENTY of fluids . Isolate yourself from everyone, even family, until test results have returned  If your COVID-19 test is positive . Then you ARE INFECTED and you can pass the virus to others . You must quarantine from others for a minimum of  o 10 days since symptoms started AND o You are fever  free for 24 hours WITHOUT any medication to reduce fever AND o Your symptoms are improving . Do not go to the store or other public areas . Do not go around household members who are not known to be infected with COVID-19 . If you MUST leave your area of quarantine (example: go to a bathroom you share with others in your home), you must o Wear a mask o Wash your hands thoroughly o Wipe down any surfaces you touch . Do not share food, drinks, towels, or other items with other persons . Dispose of your own tissues, food containers, etc  Once you have recovered, please continue good preventive care measures, including:  . wearing a mask when in public . wash your hands frequently . avoid touching your face/nose/eyes . cover coughs/sneezes with the inside of your elbow . stay out of crowds . keep a 6 foot distance from others  If you develop severe shortness of breath, uncontrolled fevers, coughing up blood, confusion, chest pain, or signs of dehydration (such as significantly decreased urine amounts or dizziness with standing) please go to the ER.    From UpToDate  Type, proportion, and duration of persistent COVID-19 symptoms* Persistent symptom Proportion of patients affected by symptom Approximate time to symptom resolution?  Common physical symptoms  Fatigue 15 to 87%[1,2,6,9,14] 3 months or longer  Dyspnea 10 to 71%[1,2,6-9,14] 2 to 3 months or longer  Chest discomfort 12 to 44%[1,2] 2 to 3 months  Cough  17 to 34%[1,2,9,12] 2 to 3 months or longer  Anosmia 10 to 13%[1,3-5,9,11] 1 month, rarely longer  Less common physical symptoms  Joint pain, headache, sicca syndrome, rhinitis, dysgeusia, poor appetite, dizziness, vertigo, myalgias, insomnia, alopecia, sweating, and diarrhea <10%[1,2,8,9,11] Unknown (likely weeks to months)  Psychologic and neurocognitive  Post-traumatic stress disorder  7 to 24%[6,10, 14] 6 weeks to 3 months or longer  Impaired memory 18 to 21%[6,15]  weeks  to months  Poor concentration 16%[6] Weeks to months  Anxiety/depression 22 to 23%[2,7,8,10, 12,13, 14] Weeks to months  Reduction in quality of life >50%[8] Unknown (likely weeks to months)  COVID-19: coronavirus disease 2019. * These data are derived from an earlier period in the pandemic; information on patient recovery and persistent symptoms is evolving, and these figures may change as longer-term data emerge.  More than a third of patients with COVID-19 experience more than one persistent symptom. ? Time course for recovery varies depending on premorbid risk factors and illness severity and may be shorter or longer than that listed. Hospitalized patients, and in particular critically ill patients, are more likely to have a more protracted course than those with mild disease.

## 2020-10-03 ENCOUNTER — Encounter: Payer: Self-pay | Admitting: Family Medicine

## 2020-10-03 DIAGNOSIS — M5416 Radiculopathy, lumbar region: Secondary | ICD-10-CM

## 2020-10-03 MED ORDER — PREDNISONE 50 MG PO TABS: 50.0000 mg | ORAL_TABLET | Freq: Every day | ORAL | 0 refills | Status: DC

## 2020-10-11 ENCOUNTER — Other Ambulatory Visit: Payer: Self-pay | Admitting: Family Medicine

## 2020-10-11 ENCOUNTER — Ambulatory Visit
Admission: RE | Admit: 2020-10-11 | Discharge: 2020-10-11 | Disposition: A | Discharge status: Home / Self Care | Payer: 59 | Source: Ambulatory Visit | Attending: Family Medicine | Admitting: Family Medicine

## 2020-10-11 DIAGNOSIS — M5416 Radiculopathy, lumbar region: Secondary | ICD-10-CM

## 2020-10-11 MED ORDER — METHYLPREDNISOLONE ACETATE 40 MG/ML INJ SUSP (RADIOLOG
80.0000 mg | Freq: Once | INTRAMUSCULAR | Status: AC
  Administered 2020-10-11: 80 mg via EPIDURAL

## 2020-10-11 MED ORDER — IOPAMIDOL (ISOVUE-M 200) INJECTION 41%
1.0000 mL | Freq: Once | INTRAMUSCULAR | Status: AC
  Administered 2020-10-11: 1 mL via EPIDURAL

## 2020-10-11 NOTE — Discharge Instructions (Signed)

## 2020-10-16 NOTE — Progress Notes (Signed)
I, Wendy Poet, LAT, ATC, am serving as scribe for Dr. Lynne Leader.  Glen Chambers is a 60 y.o. male who presents to [place] at [facility] today for f/u of L leg pain / lumbar radiculopathy.  He was last seen by Dr. Georgina Snell on 08/08/20 ad noted radiating pain from his L post hip/glute to his ant-medial calf and some weakness w/ L ankle DF.  He had a prior course of PT w/ little improvement noted.  He was prescribed prednisone and gabapentin and referred for an L-spine MRI.  He then had a series of 3 lumbar epidurals on 08/15/20, 08/30/20 and most recently on 10/11/20 (L L4 nerve root block and transforaminal epidural).  Since his last visit w/ Dr. Georgina Snell, pt reports no improvement after last epidural injection. Pt reports major numbness/tingling in L leg w/ sharp pains at times. Pt notes LBP is tolerable, but is still painful.  Diagnostic testing: L-spine MRI- 08/13/20; L-spine XR- 08/08/20   Pertinent review of systems: No fevers or chills  Relevant historical information: History of melanoma status postresection last year.  History of total hip replacement right   Exam:  BP (!) 148/86 (BP Location: Right Arm, Patient Position: Sitting, Cuff Size: Normal)   Pulse 76   Ht 5\' 8"  (1.727 m)   Wt 232 lb (105.2 kg)   SpO2 99%   BMI 35.28 kg/m  General: Well Developed, well nourished, and in no acute distress.   MSK: L-spine normal motion intact strength lower extremity    Lab and Radiology Results  EXAM: MRI LUMBAR SPINE WITHOUT CONTRAST  TECHNIQUE: Multiplanar, multisequence MR imaging of the lumbar spine was performed. No intravenous contrast was administered.  COMPARISON:  08/08/2020.  FINDINGS: Segmentation: Standard. Congenital spinal canal narrowing, likely secondary to short pedicles.  Alignment: Lumbar dextrocurvature. Trace L2-3 retrolisthesis. Trace L3-4, L5-S1 anterolisthesis.  Vertebrae: No fracture or aggressive osseous lesion. Modic type  1 endplate degenerative changes most prominent at the L2-3 level.  Conus medullaris and cauda equina: Conus extends to the L1 level. Conus and cauda equina appear normal.  Disc levels: Multilevel desiccation.  T12-L1: No significant disc bulge. Facet degenerative spurring. Patent spinal canal and neural foramen.  L1-2: Minimal disc bulge, ligamentum flavum thickening and bilateral facet hypertrophy. Patent spinal canal and right neural foramen. Mild left neural foraminal narrowing.  L2-3: Disc bulge with superimposed left foraminal protrusion. Bilateral facet hypertrophy and prominent ligamentum flavum. Mild spinal canal, severe left and mild right neural foraminal narrowing.  L3-4: Mild disc bulge with superimposed left extraforaminal protrusion. Bilateral facet hypertrophy. Ligamentum flavum thickening. Mild spinal canal, severe left and mild right neural foraminal narrowing.  L4-5: Mild disc bulge with superimposed right paracentral/foraminal protrusion. Bilateral facet hypertrophy. Patent spinal canal. Severe right and mild left neural foraminal narrowing.  L5-S1: Disc bulge with shallow central protrusion. Bilateral facet hypertrophy. Query right laminectomy. Patent spinal canal and right neural foramen. Mild left neural foraminal narrowing.  Paraspinal and other soft tissues: Negative.  IMPRESSION: Congenital spinal canal narrowing with superimposed spondylosis.  Mild L2-3, L3-4 spinal canal narrowing.  Severe left L2-4, right L4-5 neural foraminal narrowing.   Electronically Signed   By: Primitivo Gauze M.D.   On: 08/14/2020 10:26  I, Lynne Leader, personally (independently) visualized and performed the interpretation of the images attached in this note.    Assessment and Plan: 61 y.o. male with left lumbar radiculopathy at L4 dermatomal pattern.  Patient has had 3 epidural steroid injections and is  failing conservative management.. At this  point surgery is his best option.  Plan to refer to neurosurgery to discuss options.  On MRI from February he has significant neuroforaminal stenosis at more than just left L4 neuroforamen so is possible he may benefit from a multilevel laminectomy and fusion.  I am not sure which way neurosurgery will recommend.  Discussed treatment plan and options.  Referral placed today.  Limited refill of prednisone ordered today.  Use this only for significant pain emergencies.  Continue gabapentin.  Recheck as needed.   PDMP not reviewed this encounter. Orders Placed This Encounter  Procedures  . Ambulatory referral to Neurosurgery    Referral Priority:   Routine    Referral Type:   Surgical    Referral Reason:   Specialty Services Required    Requested Specialty:   Neurosurgery    Number of Visits Requested:   1   Meds ordered this encounter  Medications  . predniSONE (DELTASONE) 50 MG tablet    Sig: Take 1 tablet (50 mg total) by mouth daily.    Dispense:  5 tablet    Refill:  0     Discussed warning signs or symptoms. Please see discharge instructions. Patient expresses understanding.   The above documentation has been reviewed and is accurate and complete Lynne Leader, M.D.

## 2020-10-17 ENCOUNTER — Other Ambulatory Visit: Payer: Self-pay

## 2020-10-17 ENCOUNTER — Ambulatory Visit (INDEPENDENT_AMBULATORY_CARE_PROVIDER_SITE_OTHER): Payer: 59 | Admitting: Family Medicine

## 2020-10-17 VITALS — BP 148/86 | HR 76 | Ht 68.0 in | Wt 232.0 lb

## 2020-10-17 DIAGNOSIS — M5416 Radiculopathy, lumbar region: Secondary | ICD-10-CM | POA: Diagnosis not present

## 2020-10-17 MED ORDER — PREDNISONE 50 MG PO TABS: 50.0000 mg | ORAL_TABLET | Freq: Every day | ORAL | 0 refills | Status: DC

## 2020-10-17 NOTE — Patient Instructions (Signed)
Thank you for coming in today.  You should hear soon about the surgery referral.   Let me know if you do not hear anything.   Keep me updated.

## 2020-10-18 ENCOUNTER — Encounter: Payer: Self-pay | Admitting: Family Medicine

## 2020-10-18 DIAGNOSIS — M5416 Radiculopathy, lumbar region: Secondary | ICD-10-CM

## 2021-01-24 ENCOUNTER — Other Ambulatory Visit: Payer: Self-pay | Admitting: Neurosurgery

## 2021-01-24 DIAGNOSIS — M5416 Radiculopathy, lumbar region: Secondary | ICD-10-CM

## 2021-02-01 ENCOUNTER — Other Ambulatory Visit: Payer: 59

## 2021-02-09 ENCOUNTER — Other Ambulatory Visit: Payer: Self-pay | Admitting: Family Medicine

## 2021-02-11 ENCOUNTER — Ambulatory Visit
Admission: RE | Admit: 2021-02-11 | Discharge: 2021-02-11 | Disposition: A | Discharge status: Home / Self Care | Payer: 59 | Source: Ambulatory Visit | Attending: Neurosurgery | Admitting: Neurosurgery

## 2021-02-11 DIAGNOSIS — M5416 Radiculopathy, lumbar region: Secondary | ICD-10-CM

## 2021-02-11 MED ORDER — GADOBENATE DIMEGLUMINE 529 MG/ML IV SOLN
20.0000 mL | Freq: Once | INTRAVENOUS | Status: AC | PRN
  Administered 2021-02-11: 20 mL via INTRAVENOUS

## 2021-02-22 ENCOUNTER — Ambulatory Visit: Payer: 59 | Admitting: Family Medicine

## 2021-02-25 ENCOUNTER — Ambulatory Visit: Payer: 59 | Admitting: Family Medicine

## 2021-03-06 ENCOUNTER — Ambulatory Visit: Payer: 59 | Admitting: Family Medicine

## 2021-03-16 ENCOUNTER — Other Ambulatory Visit: Payer: Self-pay | Admitting: Family Medicine

## 2021-05-17 ENCOUNTER — Other Ambulatory Visit: Payer: Self-pay | Admitting: Family Medicine

## 2021-07-14 ENCOUNTER — Other Ambulatory Visit: Payer: Self-pay | Admitting: Family Medicine

## 2021-08-21 ENCOUNTER — Other Ambulatory Visit: Payer: Self-pay | Admitting: Family Medicine

## 2021-09-10 ENCOUNTER — Ambulatory Visit (INDEPENDENT_AMBULATORY_CARE_PROVIDER_SITE_OTHER): Payer: 59 | Admitting: Sports Medicine

## 2021-09-10 ENCOUNTER — Ambulatory Visit (INDEPENDENT_AMBULATORY_CARE_PROVIDER_SITE_OTHER): Payer: 59

## 2021-09-10 ENCOUNTER — Other Ambulatory Visit: Payer: Self-pay

## 2021-09-10 ENCOUNTER — Other Ambulatory Visit: Payer: Self-pay | Admitting: Family Medicine

## 2021-09-10 DIAGNOSIS — M1711 Unilateral primary osteoarthritis, right knee: Secondary | ICD-10-CM | POA: Diagnosis not present

## 2021-09-10 DIAGNOSIS — M25461 Effusion, right knee: Secondary | ICD-10-CM | POA: Insufficient documentation

## 2021-09-10 DIAGNOSIS — M25561 Pain in right knee: Secondary | ICD-10-CM | POA: Diagnosis not present

## 2021-09-10 DIAGNOSIS — Z09 Encounter for follow-up examination after completed treatment for conditions other than malignant neoplasm: Secondary | ICD-10-CM

## 2021-09-10 DIAGNOSIS — M7989 Other specified soft tissue disorders: Secondary | ICD-10-CM | POA: Diagnosis not present

## 2021-09-10 NOTE — Assessment & Plan Note (Signed)
This is a very pleasant 61 year old male, very healthy, he has been having some increasing pain in his right knee with swelling, no mechanical symptoms, moderate gelling. ?On exam he has a moderate effusion and tenderness at the medial joint line, we did an aspiration injection, adding x-rays, he does have some knee sleeves at home, return to see me in 6 weeks. ?

## 2021-09-10 NOTE — Progress Notes (Signed)
? ? ?  Procedures performed today:   ? ?Procedure: Real-time Ultrasound Guided aspiration/injection of right knee ?Device: Samsung HS60  ?Verbal informed consent obtained.  ?Time-out conducted.  ?Noted no overlying erythema, induration, or other signs of local infection.  ?Skin prepped in a sterile fashion.  ?Local anesthesia: Topical Ethyl chloride.  ?With sterile technique and under real time ultrasound guidance: Noted moderate effusion, advanced an 18-gauge needle into the suprapatellar recess, aspirated 30 mils of clear, straw-colored fluid, syringe switched and 1 cc Kenalog 40, 2 cc lidocaine, 2 cc bupivacaine injected easily ?Completed without difficulty  ?Advised to call if fevers/chills, erythema, induration, drainage, or persistent bleeding.  ?Images permanently stored and available for review in PACS.  ?Impression: Technically successful ultrasound guided aspiration/injection. ? ?Independent interpretation of notes and tests performed by another provider:  ? ?None. ? ?Brief History, Exam, Impression, and Recommendations:   ? ?Primary osteoarthritis of right knee ?This is a very pleasant 61 year old male, very healthy, he has been having some increasing pain in his right knee with swelling, no mechanical symptoms, moderate gelling. ?On exam he has a moderate effusion and tenderness at the medial joint line, we did an aspiration injection, adding x-rays, he does have some knee sleeves at home, return to see me in 6 weeks. ? ? ? ?___________________________________________ ?Gwen Her. Dianah Field, M.D., ABFM., CAQSM. ?Primary Care and Sports Medicine ?[place] ? ?Adjunct Instructor of Family Medicine  ?[employer] ?

## 2021-09-12 ENCOUNTER — Encounter: Payer: Self-pay | Admitting: Sports Medicine

## 2021-09-16 ENCOUNTER — Other Ambulatory Visit: Payer: Self-pay | Admitting: Family Medicine

## 2021-09-19 ENCOUNTER — Other Ambulatory Visit: Payer: Self-pay

## 2021-09-19 MED ORDER — ATORVASTATIN CALCIUM 20 MG PO TABS: 20.0000 mg | ORAL_TABLET | Freq: Every day | ORAL | 0 refills | Status: DC

## 2021-09-19 MED ORDER — CANDESARTAN CILEXETIL-HCTZ 32-25 MG PO TABS
1.0000 | ORAL_TABLET | Freq: Every day | ORAL | 0 refills | Status: DC
Start: 2021-09-19 — End: 2021-10-17

## 2021-09-19 MED ORDER — TESTOSTERONE CYPIONATE 200 MG/ML IM SOLN
INTRAMUSCULAR | 0 refills | Status: DC
Start: 2021-09-19 — End: 2021-09-19

## 2021-09-19 MED ORDER — TESTOSTERONE CYPIONATE 200 MG/ML IM SOLN: INTRAMUSCULAR | 0 refills | Status: DC

## 2021-09-20 ENCOUNTER — Other Ambulatory Visit: Payer: Self-pay | Admitting: Family Medicine

## 2021-10-12 ENCOUNTER — Other Ambulatory Visit: Payer: Self-pay | Admitting: Medical-Surgical

## 2021-10-16 IMAGING — DX DG HIP (WITH OR WITHOUT PELVIS) 2-3V*R*
3 series · 3 of 3 positions shown · non-contrast
Comparison: None.

CLINICAL DATA: Pain

EXAM:
DG HIP (WITH OR WITHOUT PELVIS) 2-3V RIGHT

[pelvis ap]
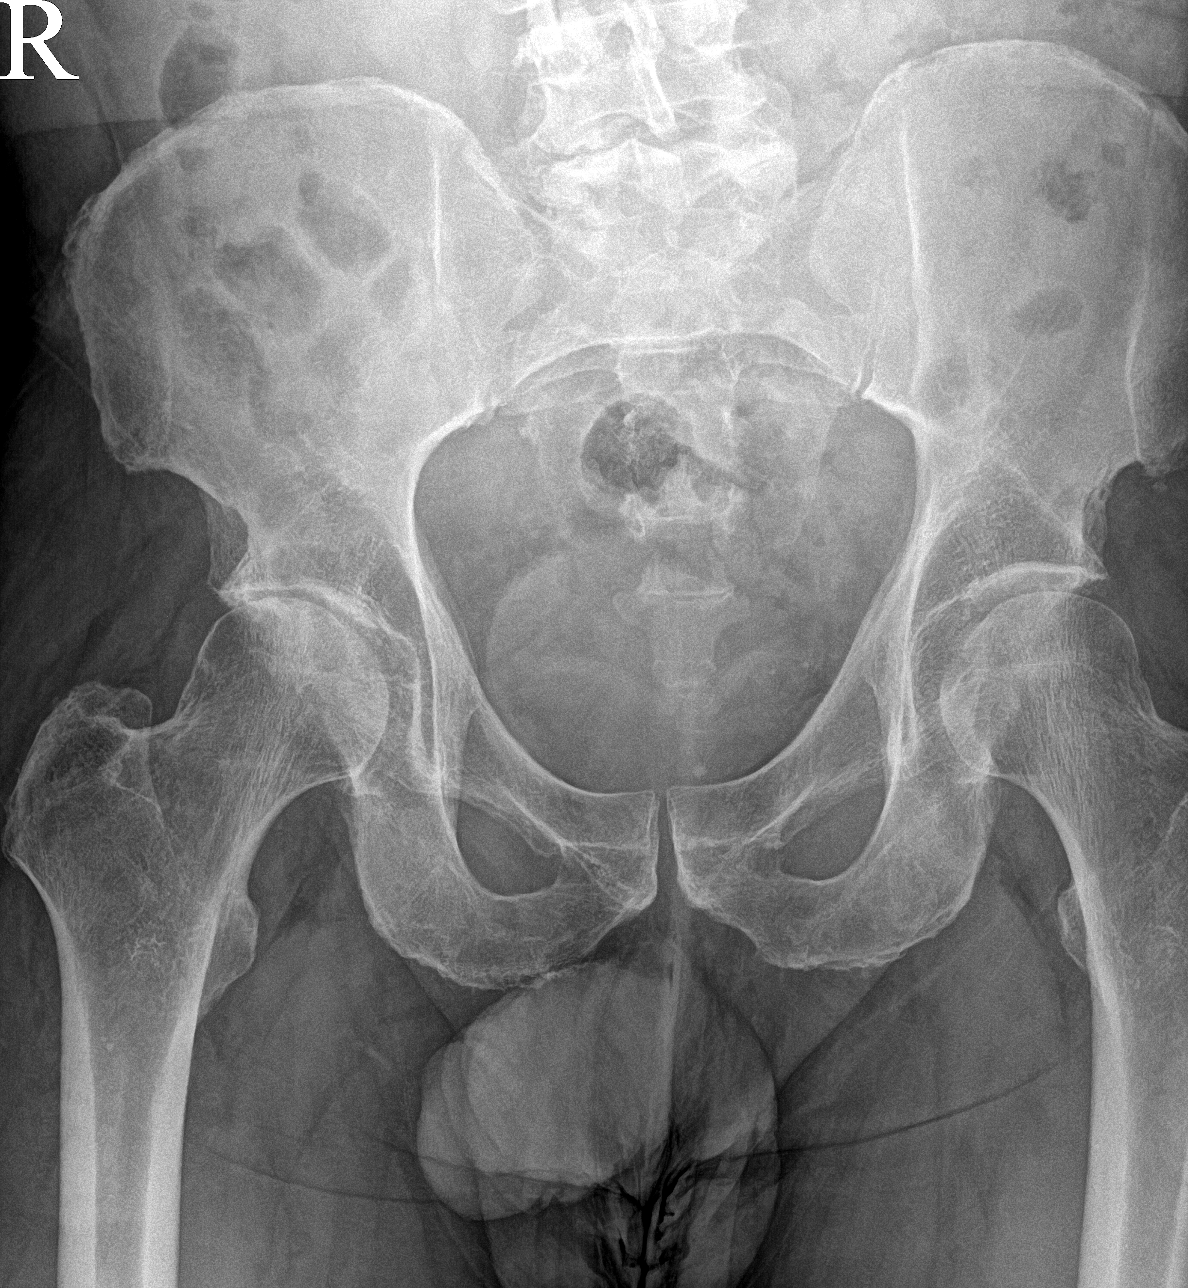

[hip ap]
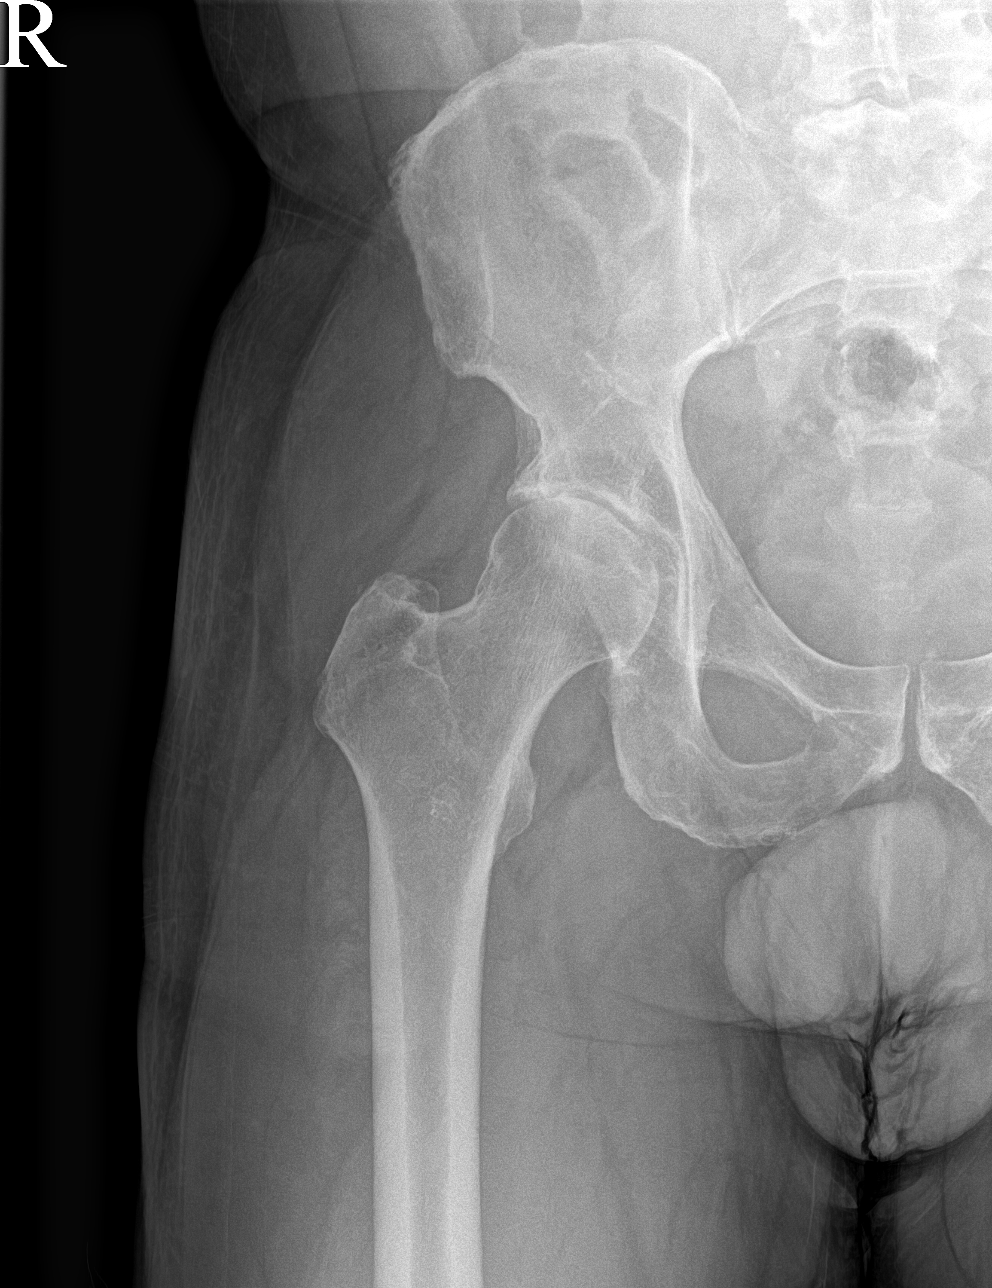

[hip frog leg]
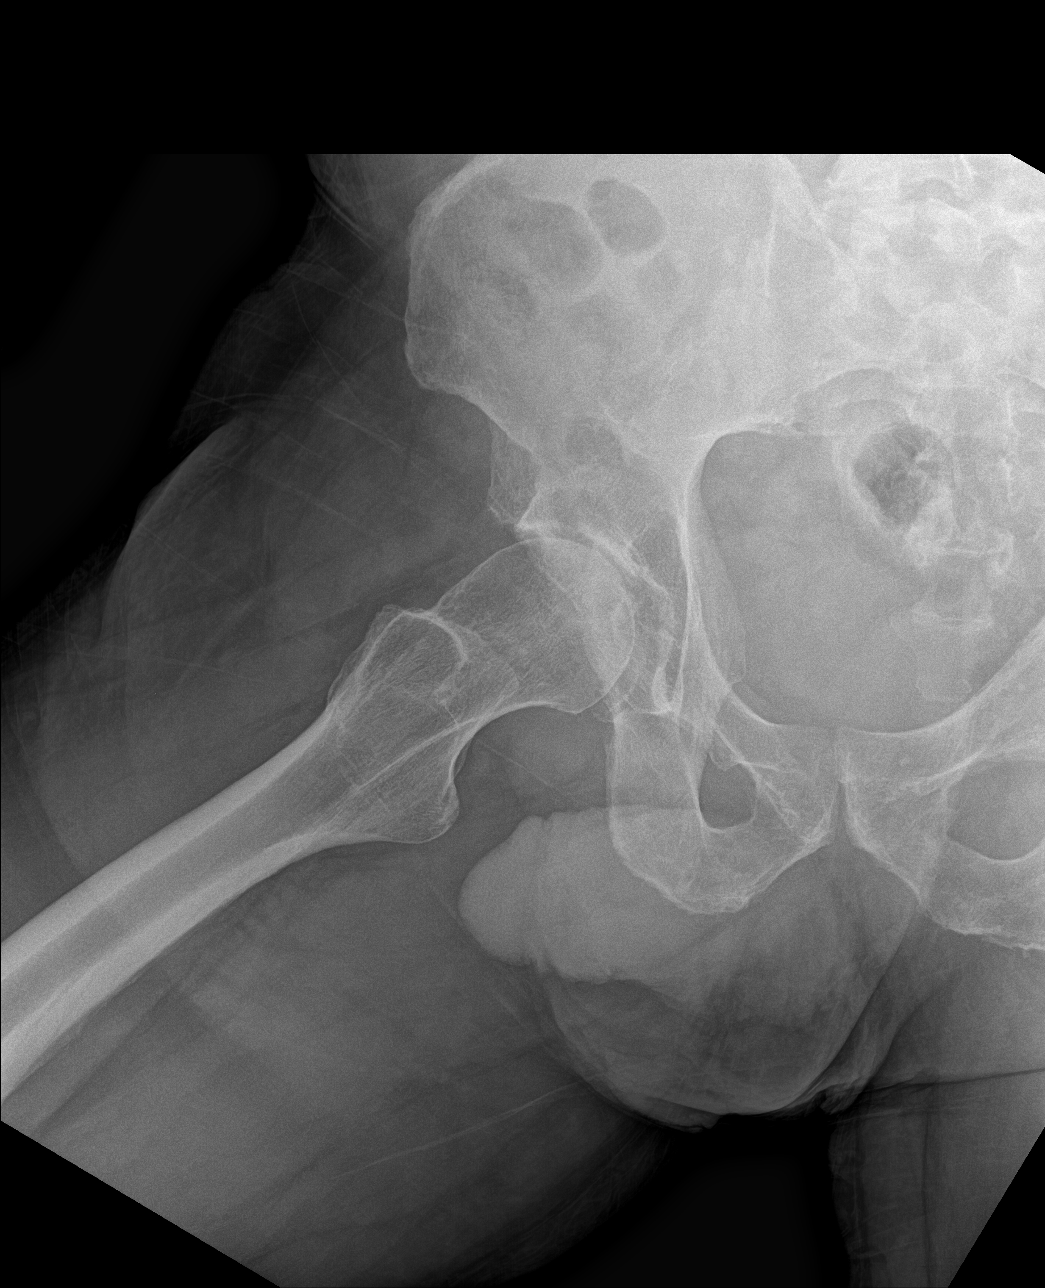

[3 of 3 positions shown; findings below may reference images not displayed]

FINDINGS: There is no evidence of hip fracture or dislocation. Mild right hip
osteoarthrosis including subchondral sclerosis and degenerative
spurring. Mild superior joint space loss.

Bilateral SI joints and pubic symphysis are approximated.
IMPRESSION: Mild right hip osteoarthrosis.

No acute osseous abnormality.

## 2021-10-18 ENCOUNTER — Ambulatory Visit (INDEPENDENT_AMBULATORY_CARE_PROVIDER_SITE_OTHER): Payer: 59 | Admitting: Family Medicine

## 2021-10-18 ENCOUNTER — Encounter: Payer: Self-pay | Admitting: Family Medicine

## 2021-10-18 VITALS — BP 128/78 | HR 74 | Wt 213.0 lb

## 2021-10-18 DIAGNOSIS — Z1211 Encounter for screening for malignant neoplasm of colon: Secondary | ICD-10-CM

## 2021-10-18 DIAGNOSIS — I10 Essential (primary) hypertension: Secondary | ICD-10-CM

## 2021-10-18 DIAGNOSIS — J309 Allergic rhinitis, unspecified: Secondary | ICD-10-CM

## 2021-10-18 DIAGNOSIS — R7989 Other specified abnormal findings of blood chemistry: Secondary | ICD-10-CM

## 2021-10-18 DIAGNOSIS — G4733 Obstructive sleep apnea (adult) (pediatric): Secondary | ICD-10-CM

## 2021-10-18 DIAGNOSIS — E782 Mixed hyperlipidemia: Secondary | ICD-10-CM

## 2021-10-18 MED ORDER — PREDNISONE 50 MG PO TABS: ORAL_TABLET | ORAL | 0 refills | Status: DC

## 2021-10-18 MED ORDER — ATORVASTATIN CALCIUM 20 MG PO TABS: ORAL_TABLET | ORAL | 1 refills | Status: DC

## 2021-10-18 MED ORDER — SILDENAFIL CITRATE 100 MG PO TABS: 50.0000 mg | ORAL_TABLET | Freq: Every day | ORAL | 11 refills | Status: DC | PRN

## 2021-10-18 MED ORDER — TESTOSTERONE CYPIONATE 200 MG/ML IM SOLN: 100.0000 mg | INTRAMUSCULAR | 1 refills | Status: DC

## 2021-10-18 MED ORDER — AMLODIPINE BESYLATE 10 MG PO TABS: 10.0000 mg | ORAL_TABLET | Freq: Every day | ORAL | 1 refills | Status: DC

## 2021-10-18 MED ORDER — CANDESARTAN CILEXETIL-HCTZ 32-25 MG PO TABS: ORAL_TABLET | ORAL | 1 refills | Status: DC

## 2021-10-18 NOTE — Assessment & Plan Note (Signed)
Doing well with CPAP.  May be interested in inspire device in the future.  ?

## 2021-10-18 NOTE — Assessment & Plan Note (Signed)
Doing well with atorvastatin.  Update lipid panel.  ?

## 2021-10-18 NOTE — Assessment & Plan Note (Signed)
BP well controlled at this time. Recommend continuation of current medications and active lifestyle.  Updated labs done today.  ?

## 2021-10-18 NOTE — Assessment & Plan Note (Signed)
Adding course of prednisone.  Recommend adding cetirizine daily.  ?

## 2021-10-18 NOTE — Progress Notes (Signed)
?Glen Chambers - 61 y.o. male MRN [id number]  Date of birth: 07-Sep-1960 ? ?Subjective ?Chief Complaint  ?Patient presents with  ? Hyperlipidemia  ? Hypertension  ? ? ?HPI ?Glen Chambers is a 61 y.o. male here today for follow up visit.  Reports that he is doing pretty good.  He had recent visit with Dr. Dianah Field for knee pain.  Feeling better since having aspiration.  ? ?He continues on amlodipine and candesartan/hctz for management of blood pressure.  This is well controlled at this time.  He denies side effects related to medication.  Denies chest pain, shortness of breath,palpitations, headache or vision changes.   ? ?Feels like his testosterone maybe needs to be adjusted.  Reports that effects only last for about 5-7 days.  He would like to try weekly injections.  Continues to stay very active and exercises regularly.  ? ?Tolerating atorvastatin well for management of HLD.  ? ?He has had some sinus congestion.  History of gustatory rhinitis and hasn't found anything that works well for this.  He is not currently taking an antihistamine.  ? ?ROS:  A comprehensive ROS was completed and negative except as noted per HPI ? ?Allergies  ?Allergen Reactions  ? Lisinopril Cough  ?  Mild cough with lisinopril  ? ? ?Past Medical History:  ?Diagnosis Date  ? Hypertension   ? Median nerve injury   ? Melanoma of skin ([place]) 10/26/2018  ? Skin biopsy dermatology May 2020  ? OSA (obstructive sleep apnea) 09/11/2016  ? Uses CPAP every night   ? Ulnar nerve damage, right, initial encounter   ? ? ?Past Surgical History:  ?Procedure Laterality Date  ? CARPAL TUNNEL RELEASE    ? CARPAL TUNNEL WITH CUBITAL TUNNEL    ? HERNIA REPAIR    ? left  and right  ? INCISION AND DRAINAGE Left 09/22/2018  ? Procedure: INCISION AND DRAINAGE LEFT ELBOW SEROMA;  Surgeon: Charlotte Crumb, MD;  Location: [facility];  Service: Orthopedics;  Laterality: Left;  ? ? ?Social History  ? ?Socioeconomic History  ? Marital status: Married  ?   Spouse name: Not on file  ? Number of children: 0  ? Years of education: 64  ? Highest education level: Not on file  ?Occupational History  ? Occupation: [employer]  ?Tobacco Use  ? Smoking status: Never  ? Smokeless tobacco: Never  ?Vaping Use  ? Vaping Use: Never used  ?Substance and Sexual Activity  ? Alcohol use: Yes  ?  Comment: few beers on weekend  ? Drug use: No  ? Sexual activity: Not on file  ?Other Topics Concern  ? Not on file  ?Social History Narrative  ? No children  ? Drinks caffeine  ? Two story home  ? Right handed   ? ?Social Determinants of Health  ? ?Financial Resource Strain: Not on file  ?Food Insecurity: Not on file  ?Transportation Needs: Not on file  ?Physical Activity: Not on file  ?Stress: Not on file  ?Social Connections: Not on file  ? ? ?Family History  ?Problem Relation Age of Onset  ? Hypertension Mother   ? Heart disease Father   ? Hypertension Father   ? ? ?Health Maintenance  ?Topic Date Due  ? Zoster Vaccines- Shingrix (1 of 2) Never done  ? Fecal DNA (Cologuard)  Never done  ? COVID-19 Vaccine (3 - Pfizer risk series) 10/31/2019  ? INFLUENZA VACCINE  01/14/2022  ? TETANUS/TDAP  10/22/2023  ?  Hepatitis C Screening  Completed  ? HIV Screening  Completed  ? HPV VACCINES  Aged Out  ? ? ? ?----------------------------------------------------------------------------------------------------------------------------------------------------------------------------------------------------------------- ?Physical Exam ?BP 128/78   Pulse 74   Wt 213 lb (96.6 kg)   SpO2 100%   BMI 32.39 kg/m?  ? ?Physical Exam ?Constitutional:   ?   Appearance: Normal appearance.  ?Eyes:  ?   General: No scleral icterus. ?Cardiovascular:  ?   Rate and Rhythm: Normal rate and regular rhythm.  ?Pulmonary:  ?   Effort: Pulmonary effort is normal.  ?   Breath sounds: Normal breath sounds.  ?Neurological:  ?   Mental Status: He is alert.  ?Psychiatric:     ?   Mood and Affect: Mood normal.     ?    Behavior: Behavior normal.  ? ? ?------------------------------------------------------------------------------------------------------------------------------------------------------------------------------------------------------------------- ?Assessment and Plan ? ?Essential (primary) hypertension ?BP well controlled at this time. Recommend continuation of current medications and active lifestyle.  Updated labs done today.  ? ?Low testosterone in male ?Changing testosterone to '100mg'$  weekly.  Recheck in 6 weeks.  ? ?HLD (hyperlipidemia) ?Doing well with atorvastatin.  Update lipid panel.  ? ?OSA (obstructive sleep apnea) ?Doing well with CPAP.  May be interested in inspire device in the future.  ? ?Allergic sinusitis ?Adding course of prednisone.  Recommend adding cetirizine daily.  ? ? ?Meds ordered this encounter  ?Medications  ? amLODipine (NORVASC) 10 MG tablet  ?  Sig: Take 1 tablet (10 mg total) by mouth daily.  ?  Dispense:  90 tablet  ?  Refill:  1  ? atorvastatin (LIPITOR) 20 MG tablet  ?  Sig: TAKE 1 TABLET BY MOUTH DAILY.  ?  Dispense:  90 tablet  ?  Refill:  1  ? Candesartan Cilexetil-HCTZ 32-25 MG TABS  ?  Sig: TAKE 1 TABLET BY MOUTH DAILY.  ?  Dispense:  90 tablet  ?  Refill:  1  ? sildenafil (VIAGRA) 100 MG tablet  ?  Sig: Take 0.5-1 tablets (50-100 mg total) by mouth daily as needed for erectile dysfunction.  ?  Dispense:  30 tablet  ?  Refill:  11  ? testosterone cypionate (DEPOTESTOSTERONE CYPIONATE) 200 MG/ML injection  ?  Sig: Inject 0.5 mLs (100 mg total) into the muscle once a week.  ?  Dispense:  6 mL  ?  Refill:  1  ?  Not to exceed 2 additional fills before 11/13/2021  ? predniSONE (DELTASONE) 50 MG tablet  ?  Sig: Take 1 tab po daily x5 days.  ?  Dispense:  5 tablet  ?  Refill:  0  ? ? ?No follow-ups on file. ? ? ? ?This visit occurred during the SARS-CoV-2 public health emergency.  Safety protocols were in place, including screening questions prior to the visit, additional usage of staff  PPE, and extensive cleaning of exam room while observing appropriate contact time as indicated for disinfecting solutions.  ? ?

## 2021-10-18 NOTE — Patient Instructions (Signed)
Let's try the testosterone at 0.98m ('100mg'$ ) weekly.  ?We'll recheck testosterone in about 6 weeks after starting this new dose.  ?Continue current medications.  ? ?Add prednisone for sinus congestion.  ?Try cetirizine (zyrtec) as well.  ?Limit decongestants as this can drive your BP up.  ? ?

## 2021-10-18 NOTE — Assessment & Plan Note (Signed)
Changing testosterone to '100mg'$  weekly.  Recheck in 6 weeks.  ?

## 2021-10-19 LAB — LIPID PANEL W/REFLEX DIRECT LDL
Cholesterol: 129 mg/dL (ref <200)
HDL: 41 mg/dL (ref >=40)
LDL Cholesterol (Calc): 72 mg/dL (calc)
Non-HDL Cholesterol (Calc): 88 mg/dL (calc) (ref <130)
Total CHOL/HDL Ratio: 3.1 (calc) (ref <5.0)
Triglycerides: 79 mg/dL (ref <150)

## 2021-10-19 LAB — CBC WITH DIFFERENTIAL/PLATELET
Absolute Monocytes: 1416 cells/uL — ABNORMAL HIGH (ref 200–950)
Basophils Absolute: 67 cells/uL (ref 0–200)
Basophils Relative: 0.7 %
Eosinophils Absolute: 247 cells/uL (ref 15–500)
Eosinophils Relative: 2.6 %
HCT: 52.2 % — ABNORMAL HIGH (ref 38.5–50.0)
Hemoglobin: 18.2 g/dL — ABNORMAL HIGH (ref 13.2–17.1)
Lymphs Abs: 874 cells/uL (ref 850–3900)
MCH: 33 pg (ref 27.0–33.0)
MCHC: 34.9 g/dL (ref 32.0–36.0)
MCV: 94.7 fL (ref 80.0–100.0)
MPV: 10.4 fL (ref 7.5–12.5)
Monocytes Relative: 14.9 %
Neutro Abs: 6897 cells/uL (ref 1500–7800)
Neutrophils Relative %: 72.6 %
Platelets: 252 10*3/uL (ref 140–400)
RBC: 5.51 10*6/uL (ref 4.20–5.80)
RDW: 12 % (ref 11.0–15.0)
Total Lymphocyte: 9.2 %
WBC: 9.5 10*3/uL (ref 3.8–10.8)

## 2021-10-19 LAB — COMPLETE METABOLIC PANEL WITH GFR
AG Ratio: 1.8 (calc) (ref 1.0–2.5)
ALT: 34 U/L (ref 9–46)
AST: 26 U/L (ref 10–35)
Albumin: 4.4 g/dL (ref 3.6–5.1)
Alkaline phosphatase (APISO): 58 U/L (ref 35–144)
BUN: 17 mg/dL (ref 7–25)
CO2: 26 mmol/L (ref 20–32)
Calcium: 9.3 mg/dL (ref 8.6–10.3)
Chloride: 102 mmol/L (ref 98–110)
Creat: 1.3 mg/dL (ref 0.70–1.35)
Globulin: 2.4 g/dL (calc) (ref 1.9–3.7)
Glucose, Bld: 92 mg/dL (ref 65–99)
Potassium: 4.4 mmol/L (ref 3.5–5.3)
Sodium: 139 mmol/L (ref 135–146)
Total Bilirubin: 0.7 mg/dL (ref 0.2–1.2)
Total Protein: 6.8 g/dL (ref 6.1–8.1)
eGFR: 63 mL/min/{1.73_m2} (ref >=60)

## 2021-10-19 LAB — PSA: PSA: 2.22 ng/mL (ref <=4.00)

## 2021-10-19 LAB — ESTRADIOL: Estradiol: 51 pg/mL — ABNORMAL HIGH (ref <=39)

## 2021-10-19 LAB — TESTOSTERONE: Testosterone: 769 ng/dL (ref 250–827)

## 2021-10-22 ENCOUNTER — Ambulatory Visit (INDEPENDENT_AMBULATORY_CARE_PROVIDER_SITE_OTHER): Payer: 59 | Admitting: Sports Medicine

## 2021-10-22 DIAGNOSIS — M1711 Unilateral primary osteoarthritis, right knee: Secondary | ICD-10-CM | POA: Diagnosis not present

## 2021-10-22 NOTE — Assessment & Plan Note (Addendum)
Glen Chambers returns, he is a very pleasant 61 year old, he has known knee osteoarthritis, we did an aspiration and injection at the last visit he returns today doing much better, he also noted relief of his back pain. ?Return to see me as needed, he is interested in gel injections, so if he wants to do those he knows to let us know first. ?

## 2021-10-22 NOTE — Progress Notes (Signed)
? ? ?  Procedures performed today:   ? ?None. ? ?Independent interpretation of notes and tests performed by another provider:  ? ?None. ? ?Brief History, Exam, Impression, and Recommendations:   ? ?Primary osteoarthritis of right knee ?Glen Chambers returns, he is a very pleasant 61 year old, he has known knee osteoarthritis, we did an aspiration and injection at the last visit he returns today doing much better, he also noted relief of his back pain. ?Return to see me as needed, he is interested in gel injections, so if he wants to do those he knows to let us know first. ? ? ? ?___________________________________________ ?Gwen Her. Dianah Field, M.D., ABFM., CAQSM. ?Primary Care and Sports Medicine ?[place] ? ?Adjunct Instructor of Family Medicine  ?[employer] ?

## 2021-10-27 ENCOUNTER — Encounter: Payer: Self-pay | Admitting: Family Medicine

## 2021-10-28 MED ORDER — ANASTROZOLE 1 MG PO TABS: ORAL_TABLET | ORAL | 1 refills | Status: DC

## 2021-11-06 ENCOUNTER — Encounter: Payer: Self-pay | Admitting: Sports Medicine

## 2021-11-13 ENCOUNTER — Ambulatory Visit (INDEPENDENT_AMBULATORY_CARE_PROVIDER_SITE_OTHER): Payer: 59 | Admitting: Sports Medicine

## 2021-11-13 ENCOUNTER — Ambulatory Visit (INDEPENDENT_AMBULATORY_CARE_PROVIDER_SITE_OTHER): Payer: 59

## 2021-11-13 DIAGNOSIS — M1711 Unilateral primary osteoarthritis, right knee: Secondary | ICD-10-CM

## 2021-11-13 NOTE — Progress Notes (Signed)
    Procedures performed today:    Procedure: Real-time Ultrasound Guided aspiration/injection of the right knee Device: Samsung HS60  Verbal informed consent obtained.  Time-out conducted.  Noted no overlying erythema, induration, or other signs of local infection.  Skin prepped in a sterile fashion.  Local anesthesia: Topical Ethyl chloride.  With sterile technique and under real time ultrasound guidance: Aspirated 30 mils of clear, straw-colored fluid, syringe switched and 1 cc Kenalog 40, 2 cc lidocaine, 2 cc bupivacaine injected easily Completed without difficulty  Advised to call if fevers/chills, erythema, induration, drainage, or persistent bleeding.  Images permanently stored and available for review in PACS.  Impression: Technically successful ultrasound guided aspiration/injection.  Independent interpretation of notes and tests performed by another provider:   None.  Brief History, Exam, Impression, and Recommendations:    Primary osteoarthritis of right knee Repeat aspiration and injection, last done about 6 weeks ago. He will come back in another 6 weeks and we can proceed with Visco if not better.  Chronic process with exacerbation of pharmacologic intervention  ___________________________________________ Gwen Her. Dianah Field, M.D., ABFM., CAQSM. Primary Care and [place] Instructor of [place] [employer] [facility] of Medicine

## 2021-11-13 NOTE — Assessment & Plan Note (Signed)
Repeat aspiration and injection, last done about 6 weeks ago. He will come back in another 6 weeks and we can proceed with Visco if not better.

## 2021-11-29 IMAGING — MR MR HIP*R* W/O CM
4 of 5 series · 27 of 40 positions shown · non-contrast
Comparison: Right hip x-rays dated February 16, 2020.

CLINICAL DATA: Chronic right hip pain.

EXAM:
MR OF THE RIGHT HIP WITHOUT CONTRAST
TECHNIQUE: Multiplanar, multisequence MR imaging was performed. No intravenous
contrast was administered.

[Series 3: T1 · coronal · 4.0mm · 0.85mm/px · 8 of 37 slices shown]
[im 1/37]
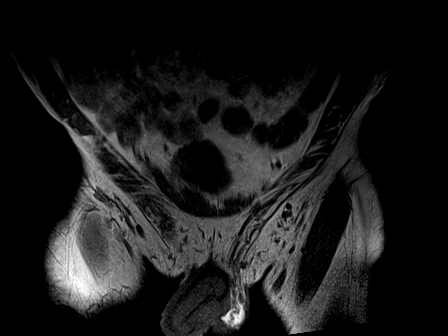
[im 5/37]
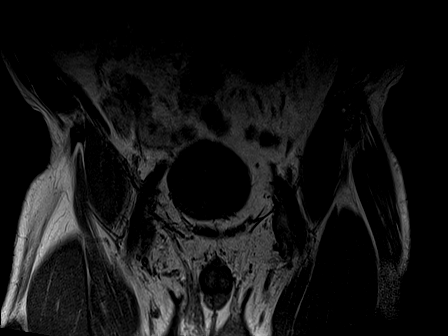
[im 13/37]
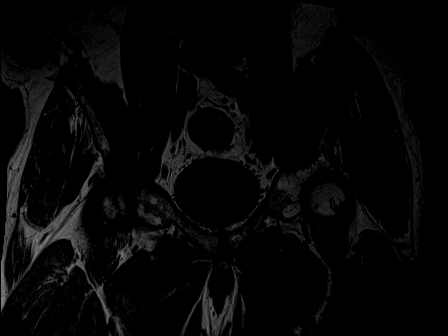
[im 17/37]
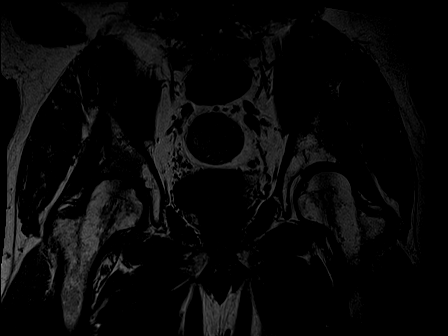
[im 21/37]
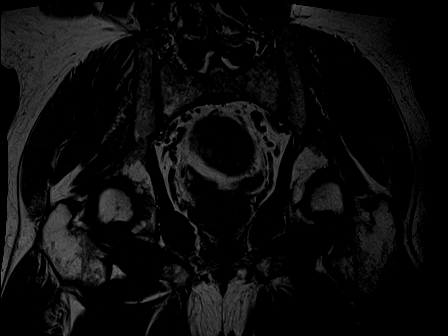
[im 25/37]
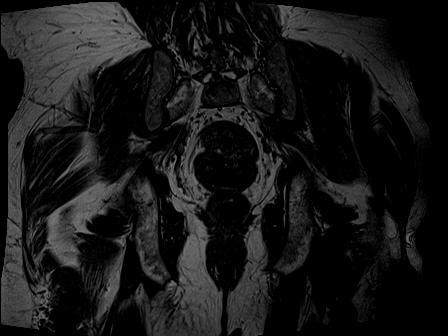
[im 33/37]
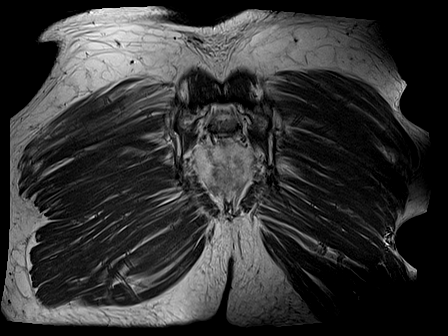
[im 37/37]
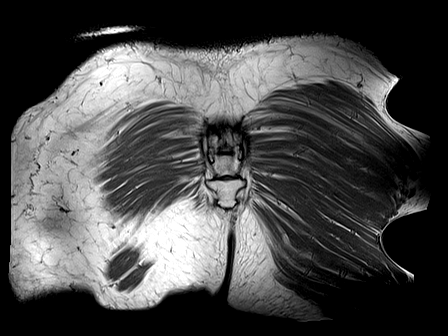

[Series 4: STIR · coronal · 4.0mm · 1.19mm/px · 6 of 36 slices shown]
[im 1/36]
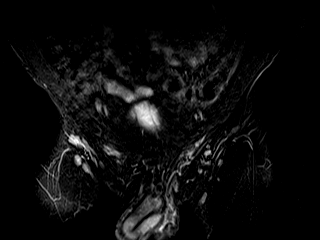
[im 5/36]
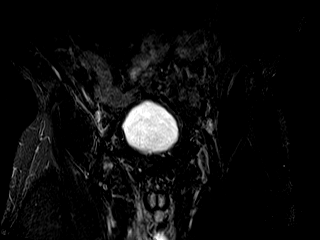
[im 9/36]
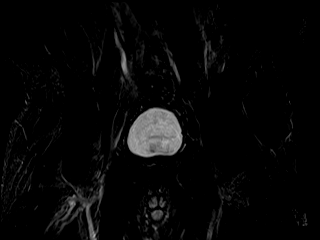
[im 14/36]
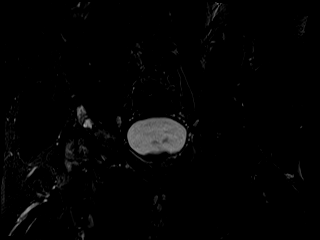
[im 18/36]
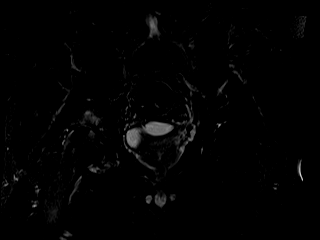
[im 31/36]
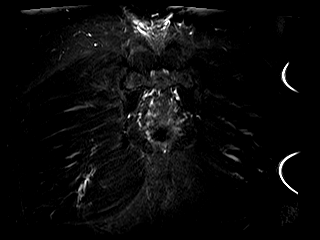

[Series 7: PD fat-sat · coronal · 4.5mm · 0.35mm/px · 6 of 23 slices shown (1 of 2)]
[im 1/23]
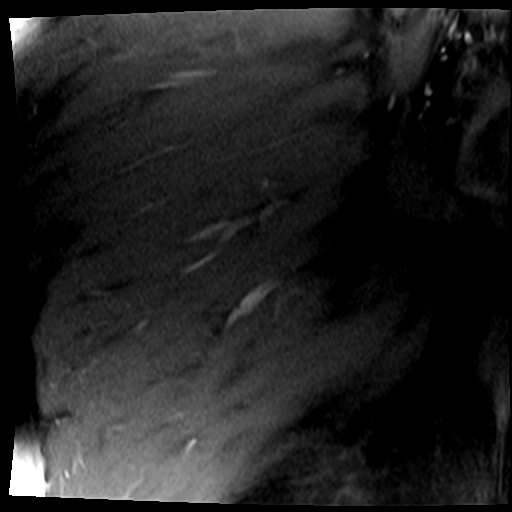
[im 5/23]
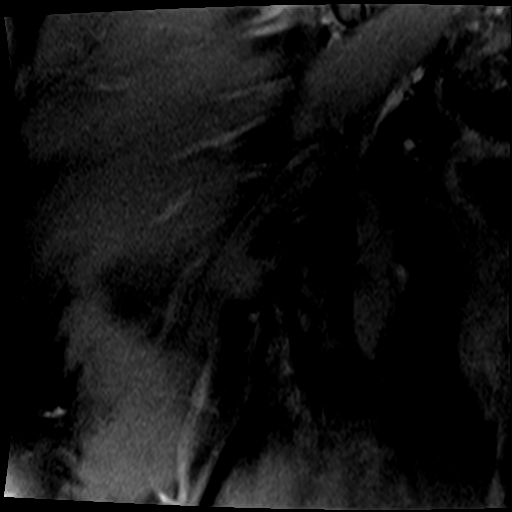
[im 9/23]
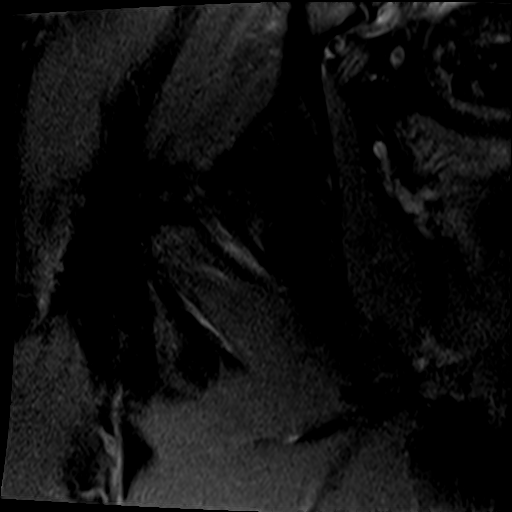
[im 14/23]
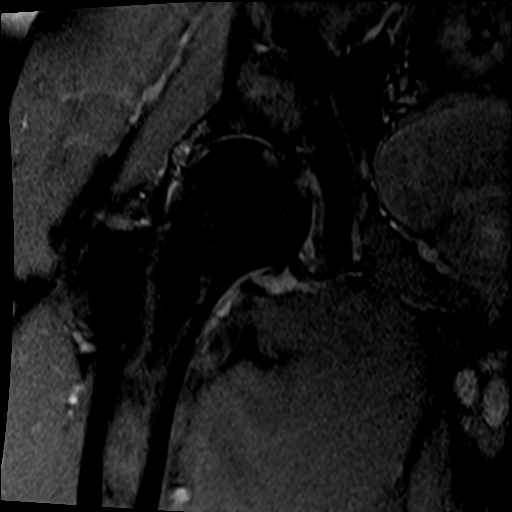
[im 18/23]
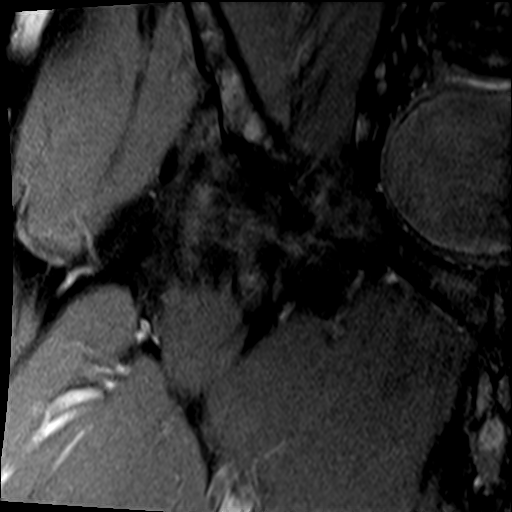
[im 23/23]
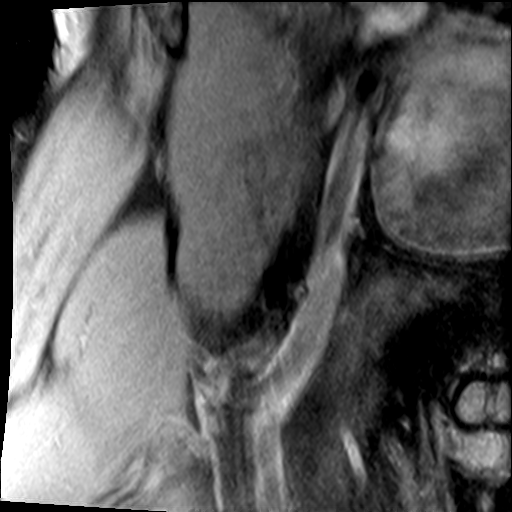

[Series 8: PD fat-sat · sagittal · 4.5mm · 0.35mm/px · 7 of 27 slices shown (2 of 2)]
[im 1/27]
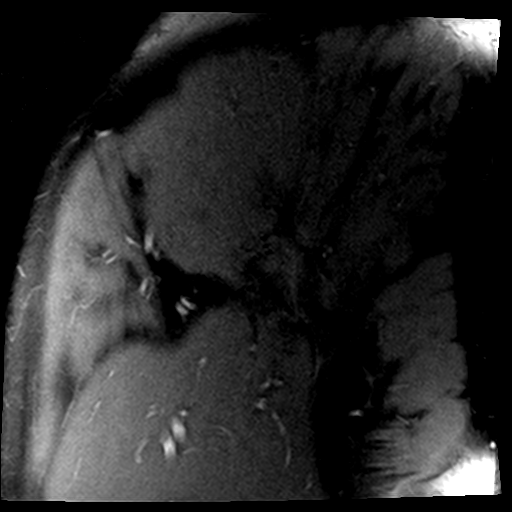
[im 5/27]
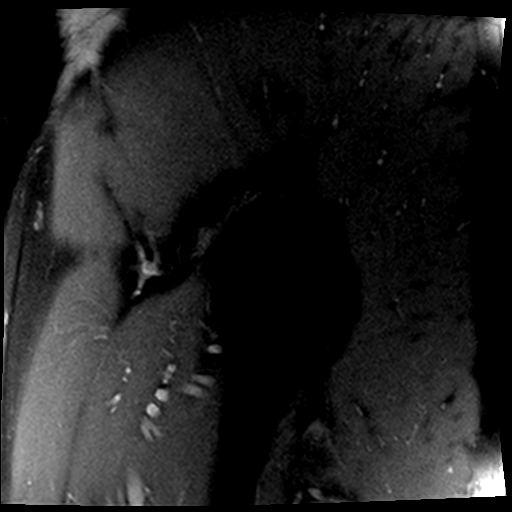
[im 9/27]
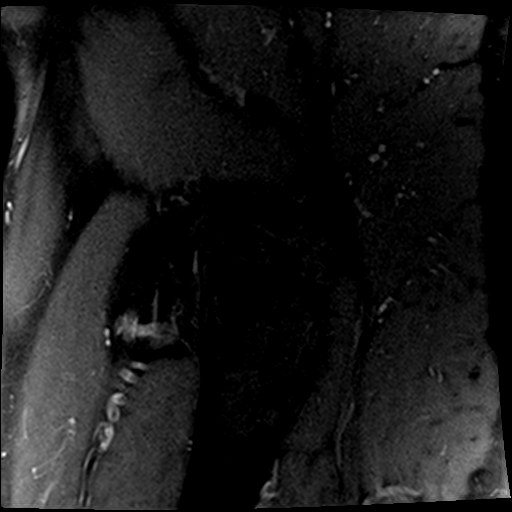
[im 14/27]
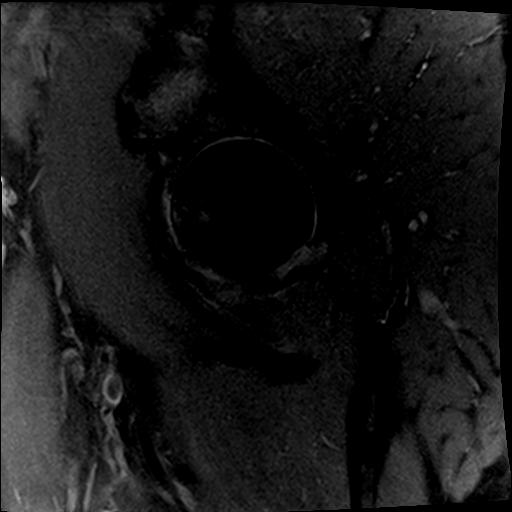
[im 18/27]
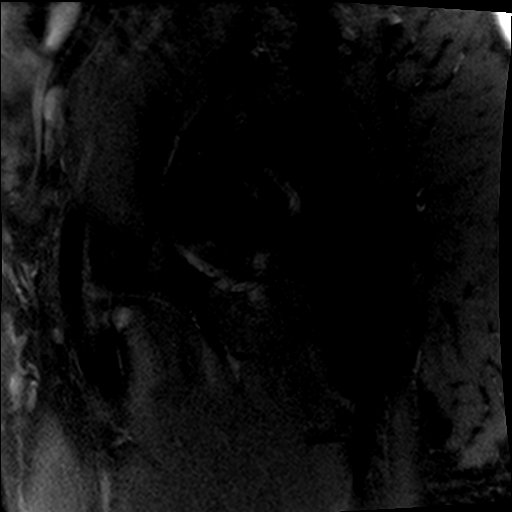
[im 22/27]
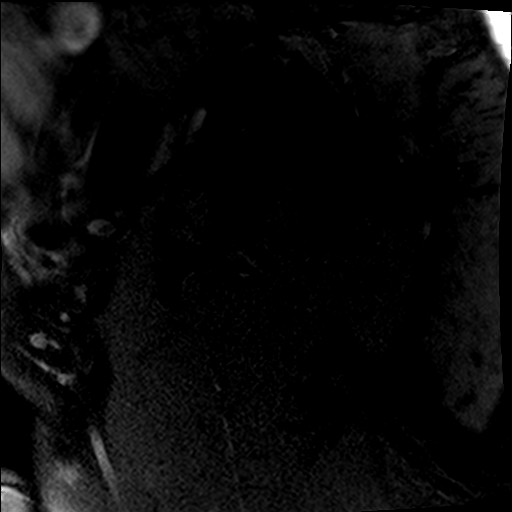
[im 27/27]
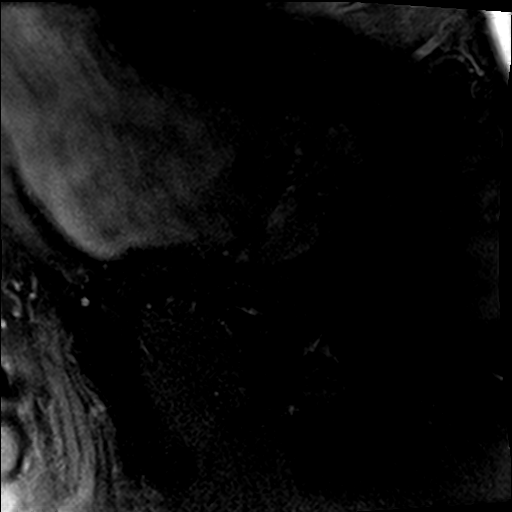

[27 of 40 positions shown; findings below may reference images not displayed]

FINDINGS: Bones: There is no evidence of acute fracture, dislocation or
avascular necrosis. No focal bone lesion. The visualized sacroiliac
joints and symphysis pubis appear normal.

Articular cartilage and labrum

Articular cartilage: Extensive partial and full-thickness cartilage
loss in the right hip joint prominent subchondral marrow edema and
cystic change in the acetabulum. Small foci of subchondral marrow
edema in the right femoral head.

Labrum: Degenerated right anterior superior labrum and torn
posterosuperior labrum.

Joint or bursal effusion

Joint effusion: No significant hip joint effusion.

Bursae: No focal periarticular fluid collection.

Muscles and tendons

Muscles and tendons: Small partial tear of the right hamstring
tendon origin. The visualized gluteus, iliopsoas, and left hamstring
tendons appear normal. No muscle edema or atrophy.

Other findings

Miscellaneous: The visualized internal pelvic contents appear
unremarkable.
IMPRESSION: 1. Moderate to severe right hip osteoarthritis.
2. Degenerated right anterior superior labrum and torn
posterosuperior labrum.
3. Small partial tear of the right hamstring tendon origin.

## 2021-12-25 ENCOUNTER — Ambulatory Visit (INDEPENDENT_AMBULATORY_CARE_PROVIDER_SITE_OTHER): Payer: 59

## 2021-12-25 ENCOUNTER — Telehealth: Payer: Self-pay | Admitting: Sports Medicine

## 2021-12-25 ENCOUNTER — Ambulatory Visit (INDEPENDENT_AMBULATORY_CARE_PROVIDER_SITE_OTHER): Payer: 59 | Admitting: Sports Medicine

## 2021-12-25 DIAGNOSIS — M25461 Effusion, right knee: Secondary | ICD-10-CM

## 2021-12-25 DIAGNOSIS — M1711 Unilateral primary osteoarthritis, right knee: Secondary | ICD-10-CM

## 2021-12-25 DIAGNOSIS — M25561 Pain in right knee: Secondary | ICD-10-CM

## 2021-12-25 MED ORDER — MELOXICAM 15 MG PO TABS: ORAL_TABLET | ORAL | 3 refills | Status: DC

## 2021-12-25 NOTE — Progress Notes (Signed)
    Procedures performed today:    Procedure: Real-time Ultrasound Guided aspiration right knee Device: Samsung HS60  Verbal informed consent obtained.  Time-out conducted.  Noted no overlying erythema, induration, or other signs of local infection.  Skin prepped in a sterile fashion.  Local anesthesia: Topical Ethyl chloride.  With sterile technique and under real time ultrasound guidance: Using 18-gauge needle I advanced into the suprapatellar recess and aspirated 60 mils of clear, straw-colored fluid. Completed without difficulty  Advised to call if fevers/chills, erythema, induration, drainage, or persistent bleeding.  Images permanently stored and available for review in PACS.  Impression: Technically successful ultrasound guided arthrocentesis.  Independent interpretation of notes and tests performed by another provider:   None.  Brief History, Exam, Impression, and Recommendations:    Pain and swelling of right knee Very pleasant 61 year old male, knee pain and swelling, aspiration and injection was done at the end of May. Unfortunately he is having recurrence. He has medial joint line pain, moderate effusion, aspirated today. Proceeding with MRI for arthroscopy planning. Switching from ibuprofen to meloxicam. He would like to work with Dr. Ennis Forts for a knee scope if needed. I would also like to get approval for viscosupplementation for postarthroscopy  Chronic process with exacerbation and pharmacologic intervention  ____________________________________________ Gwen Her. Dianah Field, M.D., ABFM., CAQSM., AME. Primary Care and Sports Medicine [facility]  Adjunct Professor of [place] [employer] [facility] of Medicine  Risk manager

## 2021-12-25 NOTE — Assessment & Plan Note (Addendum)
Very pleasant 61 year old male, knee pain and swelling, aspiration and injection was done at the end of May. Unfortunately he is having recurrence. He has medial joint line pain, moderate effusion, aspirated today. Proceeding with MRI for arthroscopy planning. Switching from ibuprofen to meloxicam. He would like to work with Dr. Ennis Forts for a knee scope if needed. I would also like to get approval for viscosupplementation for postarthroscopy

## 2021-12-25 NOTE — Telephone Encounter (Signed)
Visco approval please right knee, x-ray confirmed osteoarthritis, failed everything.

## 2021-12-27 NOTE — Telephone Encounter (Signed)
MyVisco paperwork faxed to [facility] at [phone] Request is for Orthovisc Pt's insurance prefers Orthovisc Fax confirmation receipt received  

## 2021-12-30 ENCOUNTER — Ambulatory Visit (INDEPENDENT_AMBULATORY_CARE_PROVIDER_SITE_OTHER): Payer: 59

## 2021-12-30 DIAGNOSIS — M1711 Unilateral primary osteoarthritis, right knee: Secondary | ICD-10-CM

## 2021-12-30 DIAGNOSIS — M25561 Pain in right knee: Secondary | ICD-10-CM

## 2021-12-30 DIAGNOSIS — M25461 Effusion, right knee: Secondary | ICD-10-CM | POA: Diagnosis not present

## 2022-01-01 ENCOUNTER — Encounter (INDEPENDENT_AMBULATORY_CARE_PROVIDER_SITE_OTHER): Payer: 59 | Admitting: Sports Medicine

## 2022-01-01 DIAGNOSIS — M25561 Pain in right knee: Secondary | ICD-10-CM | POA: Diagnosis not present

## 2022-01-01 DIAGNOSIS — M25461 Effusion, right knee: Secondary | ICD-10-CM | POA: Diagnosis not present

## 2022-01-08 ENCOUNTER — Other Ambulatory Visit: Payer: Self-pay | Admitting: Family Medicine

## 2022-01-09 NOTE — Telephone Encounter (Signed)
Benefits Investigation Details received from [facility] Injection: Euflexxa  Medical: Deductible does apply. Since the deductible has been met, pt is responsible for co-insurance. Once the OOP has been met, pt is covered at 100%.  PA required: Yes PA obtained through [facility] portal. Auth # [id number] good from 01/09/22 thru 07/12/22  Pharmacy: Product not covered under pharmacy plan.   Specialty Pharmacy: [facility]  May fill through: [facility] [place] [facility] Copay/Coinsurance: 20% ($28) Product Copay: 20% ($140) Administration Coinsurance: 20% ($26) Administration Copay:  Deductible: $2000 (met: $2000) Out of Pocket Max: $4000 (met: $2262.42)

## 2022-01-10 NOTE — Telephone Encounter (Signed)
Pt approx cost per injection is $194.   Left msg for a return call from pt to discuss gel injection.

## 2022-01-20 NOTE — Telephone Encounter (Signed)
Looks like he is requesting Euflexxa be purchased, please purchase 3 syringes for use only in the right knee and call patient when it arrives so we can do the injections.

## 2022-01-20 NOTE — Telephone Encounter (Signed)
I spent 5 total minutes of online digital evaluation and management services in this patient-initiated request for online care. 

## 2022-01-30 NOTE — Telephone Encounter (Signed)
Sent mychart msg to patient regarding cost.

## 2022-01-31 ENCOUNTER — Other Ambulatory Visit: Payer: Self-pay | Admitting: Family Medicine

## 2022-02-05 ENCOUNTER — Ambulatory Visit (INDEPENDENT_AMBULATORY_CARE_PROVIDER_SITE_OTHER): Payer: 59 | Admitting: Sports Medicine

## 2022-02-05 ENCOUNTER — Ambulatory Visit (INDEPENDENT_AMBULATORY_CARE_PROVIDER_SITE_OTHER): Payer: 59

## 2022-02-05 DIAGNOSIS — M1711 Unilateral primary osteoarthritis, right knee: Secondary | ICD-10-CM

## 2022-02-05 NOTE — Progress Notes (Signed)
    Procedures performed today:    Procedure: Real-time Ultrasound Guided aspiration/injection of right knee Device: Samsung HS60  Verbal informed consent obtained.  Time-out conducted.  Noted no overlying erythema, induration, or other signs of local infection.  Skin prepped in a sterile fashion.  Local anesthesia: Topical Ethyl chloride.  With sterile technique and under real time ultrasound guidance: I advanced an 18-gauge needle into the suprapatellar recess, aspirated 21 mL of clear, straw-colored fluid, syringe switched and 1 syringe of Euflexxa injected easily. Completed without difficulty  Advised to call if fevers/chills, erythema, induration, drainage, or persistent bleeding.  Images permanently stored and available for review in PACS.  Impression: Technically successful ultrasound guided aspiration/injection.  Independent interpretation of notes and tests performed by another provider:   None.  Brief History, Exam, Impression, and Recommendations:    Primary osteoarthritis of right knee This very pleasant 61 year old male returns, knee osteoarthritis, we got him approved for Euflexxa, we did an aspiration and Euflexxa injection #1 of 3 today, return in 1 week for #2 of 3.    ____________________________________________ Gwen Her. Dianah Field, M.D., ABFM., CAQSM., AME. Primary Care and Sports Medicine [place] [facility]  Adjunct Professor of [place] of [facility] of Medicine  Risk manager

## 2022-02-05 NOTE — Assessment & Plan Note (Signed)
This very pleasant 61 year old male returns, knee osteoarthritis, we got him approved for Euflexxa, we did an aspiration and Euflexxa injection #1 of 3 today, return in 1 week for #2 of 3.

## 2022-02-08 ENCOUNTER — Other Ambulatory Visit: Payer: Self-pay | Admitting: Family Medicine

## 2022-02-12 ENCOUNTER — Ambulatory Visit (INDEPENDENT_AMBULATORY_CARE_PROVIDER_SITE_OTHER): Payer: 59

## 2022-02-12 ENCOUNTER — Ambulatory Visit (INDEPENDENT_AMBULATORY_CARE_PROVIDER_SITE_OTHER): Payer: 59 | Admitting: Sports Medicine

## 2022-02-12 DIAGNOSIS — M1711 Unilateral primary osteoarthritis, right knee: Secondary | ICD-10-CM

## 2022-02-12 NOTE — Assessment & Plan Note (Signed)
Pleasant 62 year old male returns, Euflexxa No. 2 of 3 today, return in 1 week for #3 of 3 right knee.

## 2022-02-12 NOTE — Progress Notes (Signed)
    Procedures performed today:    Procedure: Real-time Ultrasound Guided aspiration/injection of right knee Device: Samsung HS60  Verbal informed consent obtained.  Time-out conducted.  Noted no overlying erythema, induration, or other signs of local infection.  Skin prepped in a sterile fashion.  Local anesthesia: Topical Ethyl chloride.  With sterile technique and under real time ultrasound guidance: I advanced an 18-gauge needle into the suprapatellar recess, aspirated 25 mL of clear, straw-colored fluid, syringe switched and 1 syringe of Euflexxa injected easily. Completed without difficulty  Advised to call if fevers/chills, erythema, induration, drainage, or persistent bleeding.  Images permanently stored and available for review in PACS.  Impression: Technically successful ultrasound guided aspiration/injection.  Independent interpretation of notes and tests performed by another provider:   None.  Brief History, Exam, Impression, and Recommendations:    Primary osteoarthritis of right knee Pleasant 61 year old male returns, Euflexxa No. 2 of 3 today, return in 1 week for #3 of 3 right knee.    ____________________________________________ Gwen Her. Dianah Field, M.D., ABFM., CAQSM., AME. Primary Care and Sports Medicine  [facility]  Adjunct Professor of [place] of [facility] of Medicine  Risk manager

## 2022-02-19 ENCOUNTER — Ambulatory Visit (INDEPENDENT_AMBULATORY_CARE_PROVIDER_SITE_OTHER): Payer: 59

## 2022-02-19 ENCOUNTER — Ambulatory Visit (INDEPENDENT_AMBULATORY_CARE_PROVIDER_SITE_OTHER): Payer: 59 | Admitting: Sports Medicine

## 2022-02-19 DIAGNOSIS — M1711 Unilateral primary osteoarthritis, right knee: Secondary | ICD-10-CM

## 2022-02-19 NOTE — Progress Notes (Signed)
    Procedures performed today:    Procedure: Real-time Ultrasound Guided aspiration/injection of right knee Device: Samsung HS60  Verbal informed consent obtained.  Time-out conducted.  Noted no overlying erythema, induration, or other signs of local infection.  Skin prepped in a sterile fashion.  Local anesthesia: Topical Ethyl chloride.  With sterile technique and under real time ultrasound guidance: I advanced an 18-gauge needle into the suprapatellar recess, aspirated 22 mL of clear, straw-colored fluid, syringe switched and 1 syringe of Euflexxa injected easily. Completed without difficulty  Advised to call if fevers/chills, erythema, induration, drainage, or persistent bleeding.  Images permanently stored and available for review in PACS.  Impression: Technically successful ultrasound guided aspiration/injection.  Independent interpretation of notes and tests performed by another provider:   None.  Brief History, Exam, Impression, and Recommendations:    Primary osteoarthritis of right knee Euflexxa 3 of 3 today, feeling pretty good, return to see me in 6 weeks.    ____________________________________________ Gwen Her. Dianah Field, M.D., ABFM., CAQSM., AME. Primary Care and Sports Medicine [place] [facility]  Adjunct Professor of [place] of [facility] of Medicine  Risk manager

## 2022-02-19 NOTE — Assessment & Plan Note (Addendum)
Euflexxa 3 of 3 today, feeling pretty good, return to see me in 6 weeks.

## 2022-03-26 ENCOUNTER — Ambulatory Visit: Payer: 59 | Admitting: Sports Medicine

## 2022-04-08 ENCOUNTER — Other Ambulatory Visit: Payer: Self-pay | Admitting: Family Medicine

## 2022-04-08 ENCOUNTER — Other Ambulatory Visit: Payer: Self-pay | Admitting: Sports Medicine

## 2022-04-08 DIAGNOSIS — M25461 Effusion, right knee: Secondary | ICD-10-CM

## 2022-04-08 DIAGNOSIS — M1711 Unilateral primary osteoarthritis, right knee: Secondary | ICD-10-CM

## 2022-04-09 ENCOUNTER — Ambulatory Visit (INDEPENDENT_AMBULATORY_CARE_PROVIDER_SITE_OTHER): Payer: 59 | Admitting: Sports Medicine

## 2022-04-09 ENCOUNTER — Ambulatory Visit (INDEPENDENT_AMBULATORY_CARE_PROVIDER_SITE_OTHER): Payer: 59

## 2022-04-09 DIAGNOSIS — M1711 Unilateral primary osteoarthritis, right knee: Secondary | ICD-10-CM

## 2022-04-09 NOTE — Assessment & Plan Note (Signed)
At this point Ron has finished Euflexxa, he is doing really well, lateral pain is gone, still has some medial pain likely from the meniscal degeneration. He does have a significant effusion, we drained this today for therapeutic purposes. He will restart his meloxicam and let me know how things are going.

## 2022-04-09 NOTE — Progress Notes (Signed)
    Procedures performed today:    Procedure: Real-time Ultrasound Guided aspiration of right knee Device: Samsung HS60  Verbal informed consent obtained.  Time-out conducted.  Noted no overlying erythema, induration, or other signs of local infection.  Skin prepped in a sterile fashion.  Local anesthesia: Topical Ethyl chloride.  With sterile technique and under real time ultrasound guidance: Using an 18-gauge needle advanced to the suprapatellar recess and aspirated 55 mL of clear, straw-colored fluid. Completed without difficulty  Advised to call if fevers/chills, erythema, induration, drainage, or persistent bleeding.  Images permanently stored and available for review in PACS.  Impression: Technically successful ultrasound guided therapeutic arthrocentesis.  Independent interpretation of notes and tests performed by another provider:   None.  Brief History, Exam, Impression, and Recommendations:    Primary osteoarthritis of right knee At this point Ron has finished Euflexxa, he is doing really well, lateral pain is gone, still has some medial pain likely from the meniscal degeneration. He does have a significant effusion, we drained this today for therapeutic purposes. He will restart his meloxicam and let me know how things are going.    ____________________________________________ Gwen Her. Dianah Field, M.D., ABFM., CAQSM., AME. Primary Care and Sports Medicine [facility]  Adjunct Professor of [place] [employer] [facility] of Medicine  Risk manager

## 2022-05-19 ENCOUNTER — Encounter: Payer: Self-pay | Admitting: Family Medicine

## 2022-05-19 MED ORDER — ROSUVASTATIN CALCIUM 20 MG PO TABS: 20.0000 mg | ORAL_TABLET | Freq: Every day | ORAL | 3 refills | Status: DC

## 2022-06-05 ENCOUNTER — Other Ambulatory Visit: Payer: Self-pay | Admitting: Family Medicine

## 2022-06-17 ENCOUNTER — Telehealth: Payer: Self-pay | Admitting: Family Medicine

## 2022-06-18 NOTE — Telephone Encounter (Signed)
Please contact patient to schedule HTN appt. Sending 30 day med refill.

## 2022-06-18 NOTE — Telephone Encounter (Signed)
Called patient LM to call back to schedule appointment also made him aware of refill that was sent.

## 2022-06-20 ENCOUNTER — Telehealth (INDEPENDENT_AMBULATORY_CARE_PROVIDER_SITE_OTHER): Payer: 59 | Admitting: Family Medicine

## 2022-06-20 ENCOUNTER — Telehealth: Payer: 59 | Admitting: Family Medicine

## 2022-06-20 DIAGNOSIS — J019 Acute sinusitis, unspecified: Secondary | ICD-10-CM | POA: Diagnosis not present

## 2022-06-20 DIAGNOSIS — U071 COVID-19: Secondary | ICD-10-CM | POA: Diagnosis not present

## 2022-06-20 MED ORDER — AMOXICILLIN 875 MG PO TABS: 875.0000 mg | ORAL_TABLET | Freq: Two times a day (BID) | ORAL | 0 refills | Status: AC

## 2022-06-20 NOTE — Progress Notes (Signed)
    Virtual Visit via Video Note  I connected with [name] on 06/20/22 at  2:40 PM EST by a video enabled telemedicine application and verified that I am speaking with the correct person using two identifiers.   I discussed the limitations of evaluation and management by telemedicine and the availability of in person appointments. The patient expressed understanding and agreed to proceed.  Patient location: at home Provider location: in office  Subjective:    CC:   Chief Complaint  Patient presents with   Covid Positive    HPI: He says he been around family over Christmas and had some sick nieces.  He said that he just felt pretty exhausted when he came back from traveling for about for 5 days and just thought he was fatigued which is Getting worse then he started to develop sinus congestion, shakes chills.  Started getting a little bit of green mucus.  He said he never had a cough.  He never actually checked his temperature but did have chills and sweats.  He states he called today because he is been gradually getting worse.  He says he feels worse now than he did earlier in the week.  He did have an old COVID kit and did do a test when he for started feeling poorly and it was positive we had to test since then that have actually been negative.  So he is not sure if he really has COVID or not.   Past medical history, Surgical history, Family history not pertinant except as noted below, Social history, Allergies, and medications have been entered into the medical record, reviewed, and corrections made.    Objective:    General: Speaking clearly in complete sentences without any shortness of breath.  Alert and oriented x3.  Normal judgment. No apparent acute distress.    Impression and Recommendations:    Problem List Items Addressed This Visit   None Visit Diagnoses     COVID-19    -  Primary   Acute non-recurrent sinusitis, unspecified location       Relevant  Medications   amoxicillin (AMOXIL) 875 MG tablet      Acute sinusitis-since has been sick for about 9 days at this point and he feels like he is getting progressively worse even today he feels like he is having sweats and chills.  Will go ahead and treat with amoxicillin.  If not better in 1 week then please give [place] a call continue with nasal saline irrigation, running a coolmist humidifier and stay well-hydrated okay to use over-the-counter medications if needed.  No orders of the defined types were placed in this encounter.   Meds ordered this encounter  Medications   amoxicillin (AMOXIL) 875 MG tablet    Sig: Take 1 tablet (875 mg total) by mouth 2 (two) times daily for 10 days.    Dispense:  14 tablet    Refill:  0     I discussed the assessment and treatment plan with the patient. The patient was provided an opportunity to ask questions and all were answered. The patient agreed with the plan and demonstrated an understanding of the instructions.   The patient was advised to call back or seek an in-person evaluation if the symptoms worsen or if the condition fails to improve as anticipated.   Beatrice Lecher, MD

## 2022-06-20 NOTE — Progress Notes (Signed)
Pt tested Positive for Covid a few days ago he did test again today and that test was Negative.  His reports that his sxs have been body aches, sinus pressure, some chills/sweats,cough with green mucus. He has taken some Mucinex for his sxs but hasn't taken any in the past 2 days.   He travels for his job and felt that part of this was due to fatigue. During christmas some of his family members were sick (sniffly/congested) he stated that he isn't sure what they may have had.

## 2022-07-02 ENCOUNTER — Encounter: Payer: Self-pay | Admitting: Family Medicine

## 2022-07-02 ENCOUNTER — Ambulatory Visit (INDEPENDENT_AMBULATORY_CARE_PROVIDER_SITE_OTHER): Payer: 59 | Admitting: Family Medicine

## 2022-07-02 VITALS — BP 127/90 | HR 151 | Ht 68.0 in | Wt 218.0 lb

## 2022-07-02 DIAGNOSIS — R7989 Other specified abnormal findings of blood chemistry: Secondary | ICD-10-CM | POA: Diagnosis not present

## 2022-07-02 DIAGNOSIS — D582 Other hemoglobinopathies: Secondary | ICD-10-CM

## 2022-07-02 DIAGNOSIS — R000 Tachycardia, unspecified: Secondary | ICD-10-CM

## 2022-07-02 DIAGNOSIS — I10 Essential (primary) hypertension: Secondary | ICD-10-CM | POA: Diagnosis not present

## 2022-07-02 DIAGNOSIS — F419 Anxiety disorder, unspecified: Secondary | ICD-10-CM

## 2022-07-02 DIAGNOSIS — E782 Mixed hyperlipidemia: Secondary | ICD-10-CM | POA: Diagnosis not present

## 2022-07-02 MED ORDER — ESCITALOPRAM OXALATE 10 MG PO TABS: 10.0000 mg | ORAL_TABLET | Freq: Every day | ORAL | 0 refills | Status: DC

## 2022-07-02 NOTE — Progress Notes (Addendum)
Karsten Howry - 62 y.o. male MRN [id number]  Date of birth: 1961/02/13  Subjective Chief Complaint  Patient presents with   Hypertension    HPI Kutter Schnepf is a 62 y.o. male here today for follow up visit.   Reports that he is doing well.   Remains on amlodipine and candesartan/hctz for management  of HTN.  His BP has remained well controlled with this.  He denies side effects related to medication.  He has been without chest pain, shortness of breath, palpitations, headache or vision changes.   Remains on testosterone and anastrazole for history of hypogonadism.  He feels good at current strength of medications.   Continues on crestor and is tolerating well.   Has had some increased anxiety over the past several months.  Has had some degree of anxiety for most of his life but worsened recently.   ROS:  A comprehensive ROS was completed and negative except as noted per HPI  Allergies  Allergen Reactions   Lisinopril Cough    Mild cough with lisinopril    Past Medical History:  Diagnosis Date   Hypertension    Median nerve injury    Melanoma of skin ([place]) 10/26/2018   Skin biopsy dermatology May 2020   OSA (obstructive sleep apnea) 09/11/2016   Uses CPAP every night    Ulnar nerve damage, right, initial encounter     Past Surgical History:  Procedure Laterality Date   CARPAL TUNNEL RELEASE     CARPAL TUNNEL WITH CUBITAL TUNNEL     HERNIA REPAIR     left  and right   INCISION AND DRAINAGE Left 09/22/2018   Procedure: INCISION AND DRAINAGE LEFT ELBOW SEROMA;  Surgeon: Charlotte Crumb, MD;  Location: [facility];  Service: Orthopedics;  Laterality: Left;    Social History   Socioeconomic History   Marital status: Married    Spouse name: Not on file   Number of children: 0   Years of education: 16   Highest education level: Not on file  Occupational History   Occupation: [employer]  Tobacco Use   Smoking status: Never   Smokeless tobacco:  Never  Vaping Use   Vaping Use: Never used  Substance and Sexual Activity   Alcohol use: Yes    Comment: few beers on weekend   Drug use: No   Sexual activity: Not on file  Other Topics Concern   Not on file  Social History Narrative   No children   Drinks caffeine   Two story home   Right handed    Social Determinants of Health   Financial Resource Strain: Not on file  Food Insecurity: Not on file  Transportation Needs: Not on file  Physical Activity: Not on file  Stress: Not on file  Social Connections: Not on file    Family History  Problem Relation Age of Onset   Hypertension Mother    Heart disease Father    Hypertension Father     Health Maintenance  Topic Date Due   INFLUENZA VACCINE  09/14/2022 (Originally 01/14/2022)   Fecal DNA (Cologuard)  06/21/2023 (Originally 10/16/2005)   COVID-19 Vaccine (3 - Pfizer risk series) 07/07/2023 (Originally 10/31/2019)   Zoster Vaccines- Shingrix (1 of 2) 09/19/2023 (Originally 10/17/1979)   DTaP/Tdap/Td (2 - Td or Tdap) 10/22/2023   Hepatitis C Screening  Completed   HIV Screening  Completed   HPV VACCINES  Aged Out     ----------------------------------------------------------------------------------------------------------------------------------------------------------------------------------------------------------------- Physical Exam BP Marland Kitchen)  127/90 (BP Location: Left Arm, Patient Position: Sitting, Cuff Size: Large)   Pulse (!) 151   Ht '5\' 8"'$  (1.727 m)   Wt 218 lb (98.9 kg)   SpO2 100%   BMI 33.15 kg/m   Physical Exam Constitutional:      Appearance: Normal appearance.  HENT:     Head: Normocephalic and atraumatic.  Eyes:     General: No scleral icterus. Cardiovascular:     Rate and Rhythm: Regular rhythm. Tachycardia present.  Pulmonary:     Effort: Pulmonary effort is normal.     Breath sounds: Normal breath sounds.  Musculoskeletal:     Cervical back: Neck supple.  Neurological:     Mental Status: He  is alert.  Psychiatric:        Mood and Affect: Mood normal.        Behavior: Behavior normal.     ------------------------------------------------------------------------------------------------------------------------------------------------------------------------------------------------------------------- Assessment and Plan  Essential (primary) hypertension BP remains well controlled today.  Recommend continuation of current medications.  F/u in 6 months.   Low testosterone in male Hemoglobin elevated previously.  Update CBC, testosterone and estradiol levels.   Tachycardia He does consume quite a bit of caffeine and admits to increased anxiety.  Recommend reduction in caffeine, staying well hydrated. Discussed coronary CT scan as well, he would like to have this.   Anxiety He would like to try medication for this.  Adding lexapro '10mg'$  daily.    Meds ordered this encounter  Medications   escitalopram (LEXAPRO) 10 MG tablet    Sig: Take 1 tablet (10 mg total) by mouth daily. Take '5mg'$  x7 days then increase to '10mg'$ .    Dispense:  90 tablet    Refill:  0    Return in about 6 months (around 12/31/2022) for HTN.    This visit occurred during the SARS-CoV-2 public health emergency.  Safety protocols were in place, including screening questions prior to the visit, additional usage of staff PPE, and extensive cleaning of exam room while observing appropriate contact time as indicated for disinfecting solutions.

## 2022-07-02 NOTE — Patient Instructions (Signed)
Stay well hydrated We'll be in touch with lab results.  You can stop downstairs and schedule CT scan.  See me again in 6 months.

## 2022-07-02 NOTE — Assessment & Plan Note (Addendum)
He would like to try medication for this.  Adding lexapro '10mg'$  daily.

## 2022-07-02 NOTE — Assessment & Plan Note (Signed)
BP remains well controlled today.  Recommend continuation of current medications.  F/u in 6 months.

## 2022-07-02 NOTE — Assessment & Plan Note (Signed)
Hemoglobin elevated previously.  Update CBC, testosterone and estradiol levels.

## 2022-07-02 NOTE — Addendum Note (Signed)
Addended by: Perlie Mayo on: 07/02/2022 09:08 AM   Modules accepted: Orders

## 2022-07-02 NOTE — Assessment & Plan Note (Signed)
He does consume quite a bit of caffeine and admits to increased anxiety.  Recommend reduction in caffeine, staying well hydrated. Discussed coronary CT scan as well, he would like to have this.

## 2022-07-03 ENCOUNTER — Ambulatory Visit (INDEPENDENT_AMBULATORY_CARE_PROVIDER_SITE_OTHER): Payer: Self-pay

## 2022-07-03 DIAGNOSIS — E782 Mixed hyperlipidemia: Secondary | ICD-10-CM

## 2022-07-03 DIAGNOSIS — R000 Tachycardia, unspecified: Secondary | ICD-10-CM

## 2022-07-03 LAB — COMPLETE METABOLIC PANEL WITH GFR
AG Ratio: 2 (calc) (ref 1.0–2.5)
ALT: 54 U/L — ABNORMAL HIGH (ref 9–46)
AST: 32 U/L (ref 10–35)
Albumin: 4.9 g/dL (ref 3.6–5.1)
Alkaline phosphatase (APISO): 58 U/L (ref 35–144)
BUN/Creatinine Ratio: 14 (calc) (ref 6–22)
BUN: 20 mg/dL (ref 7–25)
CO2: 27 mmol/L (ref 20–32)
Calcium: 10.4 mg/dL — ABNORMAL HIGH (ref 8.6–10.3)
Chloride: 103 mmol/L (ref 98–110)
Creat: 1.38 mg/dL — ABNORMAL HIGH (ref 0.70–1.35)
Globulin: 2.5 g/dL (calc) (ref 1.9–3.7)
Glucose, Bld: 93 mg/dL (ref 65–99)
Potassium: 4.2 mmol/L (ref 3.5–5.3)
Sodium: 143 mmol/L (ref 135–146)
Total Bilirubin: 1.2 mg/dL (ref 0.2–1.2)
Total Protein: 7.4 g/dL (ref 6.1–8.1)
eGFR: 58 mL/min/{1.73_m2} — ABNORMAL LOW (ref >=60)

## 2022-07-03 LAB — CBC WITH DIFFERENTIAL/PLATELET
Absolute Monocytes: 869 cells/uL (ref 200–950)
Basophils Absolute: 79 cells/uL (ref 0–200)
Basophils Relative: 1 %
Eosinophils Absolute: 553 cells/uL — ABNORMAL HIGH (ref 15–500)
Eosinophils Relative: 7 %
HCT: 55.6 % — ABNORMAL HIGH (ref 38.5–50.0)
Hemoglobin: 19.3 g/dL — ABNORMAL HIGH (ref 13.2–17.1)
Lymphs Abs: 1225 cells/uL (ref 850–3900)
MCH: 33 pg (ref 27.0–33.0)
MCHC: 34.7 g/dL (ref 32.0–36.0)
MCV: 95 fL (ref 80.0–100.0)
MPV: 10.5 fL (ref 7.5–12.5)
Monocytes Relative: 11 %
Neutro Abs: 5175 cells/uL (ref 1500–7800)
Neutrophils Relative %: 65.5 %
Platelets: 255 10*3/uL (ref 140–400)
RBC: 5.85 10*6/uL — ABNORMAL HIGH (ref 4.20–5.80)
RDW: 12.4 % (ref 11.0–15.0)
Total Lymphocyte: 15.5 %
WBC: 7.9 10*3/uL (ref 3.8–10.8)

## 2022-07-03 LAB — TESTOSTERONE: Testosterone: 192 ng/dL — ABNORMAL LOW (ref 250–827)

## 2022-07-03 LAB — ESTRADIOL: Estradiol: <15 pg/mL (ref <=39)

## 2022-07-03 LAB — TSH+FREE T4: TSH W/REFLEX TO FT4: 0.76 mIU/L (ref 0.40–4.50)

## 2022-07-03 LAB — PSA: PSA: 2.64 ng/mL (ref <=4.00)

## 2022-07-11 ENCOUNTER — Other Ambulatory Visit: Payer: Self-pay | Admitting: Family Medicine

## 2022-07-22 ENCOUNTER — Encounter: Payer: Self-pay | Admitting: Family Medicine

## 2022-08-13 ENCOUNTER — Other Ambulatory Visit: Payer: Self-pay | Admitting: Sports Medicine

## 2022-08-13 ENCOUNTER — Other Ambulatory Visit: Payer: Self-pay | Admitting: Family Medicine

## 2022-08-13 DIAGNOSIS — M25461 Effusion, right knee: Secondary | ICD-10-CM

## 2022-08-13 DIAGNOSIS — M1711 Unilateral primary osteoarthritis, right knee: Secondary | ICD-10-CM

## 2022-08-14 ENCOUNTER — Encounter: Payer: Self-pay | Admitting: Family Medicine

## 2022-09-05 ENCOUNTER — Other Ambulatory Visit (INDEPENDENT_AMBULATORY_CARE_PROVIDER_SITE_OTHER): Payer: 59

## 2022-09-05 ENCOUNTER — Ambulatory Visit (INDEPENDENT_AMBULATORY_CARE_PROVIDER_SITE_OTHER): Payer: 59 | Admitting: Sports Medicine

## 2022-09-05 ENCOUNTER — Other Ambulatory Visit: Payer: Self-pay | Admitting: Family Medicine

## 2022-09-05 DIAGNOSIS — M1711 Unilateral primary osteoarthritis, right knee: Secondary | ICD-10-CM

## 2022-09-05 DIAGNOSIS — I10 Essential (primary) hypertension: Secondary | ICD-10-CM

## 2022-09-05 NOTE — Assessment & Plan Note (Signed)
Pleasant 62 year old male, we did Euflexxa back in 2023. He does get effusions, today we did an aspiration and injection, last done October 2023. Return to see me as needed.

## 2022-09-05 NOTE — Progress Notes (Signed)
    Procedures performed today:    Procedure: Real-time Ultrasound Guided aspiration of right knee Device: Samsung HS60  Verbal informed consent obtained.  Time-out conducted.  Noted no overlying erythema, induration, or other signs of local infection.  Skin prepped in a sterile fashion.  Local anesthesia: Topical Ethyl chloride.  With sterile technique and under real time ultrasound guidance: Using an 18-gauge needle advanced to the suprapatellar recess and aspirated 15 mL of clear, straw-colored fluid, 1 cc kenalog 40, 2 cc lidocaine, 2 cc bupivacaine injected easily. Completed without difficulty  Advised to call if fevers/chills, erythema, induration, drainage, or persistent bleeding.  Images permanently stored and available for review in PACS.  Impression: Technically successful ultrasound guided therapeutic arthrocentesis.  Independent interpretation of notes and tests performed by another provider:   None.  Brief History, Exam, Impression, and Recommendations:    Primary osteoarthritis of right knee Pleasant 62 year old male, we did Euflexxa back in 2023. He does get effusions, today we did an aspiration and injection, last done October 2023. Return to see me as needed.    ____________________________________________ Gwen Her. Dianah Field, M.D., ABFM., CAQSM., AME. Primary Care and Sports Medicine [facility]  Adjunct Professor of [place] [employer] [facility] of Medicine  Risk manager

## 2022-09-10 ENCOUNTER — Ambulatory Visit: Payer: 59 | Admitting: Family Medicine

## 2022-09-12 ENCOUNTER — Other Ambulatory Visit: Payer: Self-pay | Admitting: Family Medicine

## 2022-09-23 ENCOUNTER — Ambulatory Visit: Payer: 59 | Admitting: Family Medicine

## 2022-10-11 ENCOUNTER — Other Ambulatory Visit: Payer: Self-pay | Admitting: Family Medicine

## 2022-10-12 ENCOUNTER — Other Ambulatory Visit: Payer: Self-pay | Admitting: Family Medicine

## 2022-10-14 ENCOUNTER — Encounter: Payer: Self-pay | Admitting: Family Medicine

## 2022-10-14 NOTE — Telephone Encounter (Signed)
Last fill 08/15/22 last visit 07/02/22

## 2022-10-16 ENCOUNTER — Encounter: Payer: Self-pay | Admitting: Family Medicine

## 2022-10-16 ENCOUNTER — Ambulatory Visit (INDEPENDENT_AMBULATORY_CARE_PROVIDER_SITE_OTHER): Payer: 59 | Admitting: Family Medicine

## 2022-10-16 VITALS — BP 127/89 | HR 140 | Ht 68.0 in | Wt 223.0 lb

## 2022-10-16 DIAGNOSIS — R000 Tachycardia, unspecified: Secondary | ICD-10-CM | POA: Diagnosis not present

## 2022-10-16 DIAGNOSIS — I483 Typical atrial flutter: Secondary | ICD-10-CM

## 2022-10-16 MED ORDER — METOPROLOL TARTRATE 25 MG PO TABS: 25.0000 mg | ORAL_TABLET | Freq: Two times a day (BID) | ORAL | 0 refills | Status: DC

## 2022-10-16 NOTE — Progress Notes (Signed)
Glen Chambers - 62 y.o. male MRN [id number]  Date of birth: 11/18/1960  Subjective Chief Complaint  Patient presents with   Elevated HR    HPI Glen Chambers is a 62 y.o. male here today with concern of tachycardia.  He has noted elevated heart rate.  He has felt slightly more fatigued. He continues to exercise regularly and has not noted any change in his exercise capacity.  He does have OSA that is treated with CPAP.  H&H elevated at 19.3/55.6% respectively at last visit.  He is on testosterone.  Advised to donate blood previously.  TSH normal in January.  His BP has remained well controlled with current medications. He has cut back on his caffeine intake.  No prior history of arrhythmia.   ROS:  A comprehensive ROS was completed and negative except as noted per HPI  Allergies  Allergen Reactions   Lisinopril Cough    Mild cough with lisinopril    Past Medical History:  Diagnosis Date   Hypertension    Median nerve injury    Melanoma of skin ([facility]) 10/26/2018   Skin biopsy dermatology May 2020   OSA (obstructive sleep apnea) 09/11/2016   Uses CPAP every night    Ulnar nerve damage, right, initial encounter     Past Surgical History:  Procedure Laterality Date   CARPAL TUNNEL RELEASE     CARPAL TUNNEL WITH CUBITAL TUNNEL     HERNIA REPAIR     left  and right   INCISION AND DRAINAGE Left 09/22/2018   Procedure: INCISION AND DRAINAGE LEFT ELBOW SEROMA;  Surgeon: Dairl Ponder, MD;  Location: [facility];  Service: Orthopedics;  Laterality: Left;    Social History   Socioeconomic History   Marital status: Married    Spouse name: Not on file   Number of children: 0   Years of education: 16   Highest education level: Not on file  Occupational History   Occupation: [employer]  Tobacco Use   Smoking status: Never   Smokeless tobacco: Never  Vaping Use   Vaping Use: Never used  Substance and Sexual Activity   Alcohol use: Yes    Comment: few beers  on weekend   Drug use: No   Sexual activity: Not on file  Other Topics Concern   Not on file  Social History Narrative   No children   Drinks caffeine   Two story home   Right handed    Social Determinants of Health   Financial Resource Strain: Not on file  Food Insecurity: Not on file  Transportation Needs: Not on file  Physical Activity: Not on file  Stress: Not on file  Social Connections: Not on file    Family History  Problem Relation Age of Onset   Hypertension Mother    Heart disease Father    Hypertension Father     Health Maintenance  Topic Date Due   Fecal DNA (Cologuard)  06/21/2023 (Originally 10/16/2005)   COVID-19 Vaccine (3 - Pfizer risk series) 07/07/2023 (Originally 10/31/2019)   Zoster Vaccines- Shingrix (1 of 2) 09/19/2023 (Originally 10/17/1979)   INFLUENZA VACCINE  01/15/2023   DTaP/Tdap/Td (2 - Td or Tdap) 10/22/2023   Hepatitis C Screening  Completed   HIV Screening  Completed   HPV VACCINES  Aged Out     ----------------------------------------------------------------------------------------------------------------------------------------------------------------------------------------------------------------- Physical Exam BP 127/89 (BP Location: Left Arm, Patient Position: Sitting, Cuff Size: Large)   Pulse (!) 140   Ht 5\' 8"  (1.727  m)   Wt 223 lb (101.2 kg)   SpO2 98%   BMI 33.91 kg/m   Physical Exam Constitutional:      Appearance: Normal appearance.  HENT:     Head: Normocephalic and atraumatic.  Eyes:     General: No scleral icterus. Cardiovascular:     Rate and Rhythm: Tachycardia present. Rhythm irregular.  Musculoskeletal:     Cervical back: Neck supple.  Neurological:     Mental Status: He is alert.  Psychiatric:        Mood and Affect: Mood normal.        Behavior: Behavior normal.   EKG: Atrial flutter with 2:1 AV conduction.  Rate of 140.   Incomplete RBB, present on previous  EKG.  ------------------------------------------------------------------------------------------------------------------------------------------------------------------------------------------------------------------- Assessment and Plan  Typical atrial flutter (HCC) He is relatively asymptomatic.  Checking electrolytes today.  Recommend phlebotomy for elevated H&H.  He has this set up for next week. Adding metoprolol 25mg  twice per day.  Echo ordered and referral placed to cardiology.    CHA2DS2-VASc Score =   1.  Will hold off on anticoagulation at this time.              Meds ordered this encounter  Medications   metoprolol tartrate (LOPRESSOR) 25 MG tablet    Sig: Take 1 tablet (25 mg total) by mouth 2 (two) times daily.    Dispense:  180 tablet    Refill:  0    No follow-ups on file.    This visit occurred during the SARS-CoV-2 public health emergency.  Safety protocols were in place, including screening questions prior to the visit, additional usage of staff PPE, and extensive cleaning of exam room while observing appropriate contact time as indicated for disinfecting solutions.

## 2022-10-16 NOTE — Patient Instructions (Signed)
Start metoprolol 50mg  twice per day Keep an eye on heart rate Increasing symptoms-worsening shortness of breath, chest pain, etc.  Go to the ER  Atrial Flutter  Atrial flutter is a type of abnormal heart rhythm (arrhythmia). The heart has an electrical system that tells it how to beat. In atrial flutter, the signals move rapidly in the top chambers of the heart (the atria). This makes your heart beat very fast. Atrial flutter can come and go, or it can be permanent. The goal of treatment is to prevent blood clots from forming, control your heart rate, or get your heartbeat back into a normal rhythm. If this condition is not treated, it can cause serious problems, such as a weakened heart muscle (cardiomyopathy) or a stroke. What are the causes? This condition is often caused by conditions that damage the heart's electrical system. These include: Heart conditions and heart surgery. These include heart attacks and open-heart surgery. Lung problems, such as COPD or a blood clot in the lung (pulmonary embolism, or PE). Poorly controlled high blood pressure (hypertension). Diabetes. Overactive thyroid (hyperthyroidism). Taking medicines to treat other heart rhythm problems (antiarrhythmic medicines). Obstructive sleep apnea. In some cases, the cause of this condition is not known. What increases the risk? This condition is more likely to develop in people who: Are older adults. Are overweight (obese). Have a family history of atrial flutter. Use alcohol or drugs, including cannabis. Smoke. What are the signs or symptoms? Symptoms of this condition include: A feeling that your heart is pounding or racing (palpitations). Shortness of breath. Chest pain. Feeling dizzy or light-headed. Fainting. Low blood pressure (hypotension). Feeling tired (fatigue). Tiring easily during exercise or activity. In some cases, there are no symptoms. How is this diagnosed? This condition may be diagnosed  with: An electrocardiogram (ECG) to check the electrical signals of your heart. An ambulatory cardiac monitor to record your heart's activity for a few days. An echocardiogram. This test uses ultrasound imaging to create pictures of your heart. A transesophageal echocardiogram (TEE). This uses a flexible tube passed down your esophagus to create better pictures of your heart. A stress test to check your heart's blood supply while you exercise. Imaging tests, such as a CT scan or chest X-ray. Blood tests. How is this treated? Treatment depends on underlying conditions and how you feel when you have atrial flutter. This condition may be treated with: Medicines to prevent blood clots or to treat heart rate or heart rhythm problems. Electrical cardioversion. This uses a jolt of electricity to reset the heart's rhythm. A procedure to remove the heart tissue that sends abnormal signals (ablation). Left atrial appendage closure. This is a procedure to seal off a small area of the heart where blood clots can form. In some cases, underlying conditions will be treated. Follow these instructions at home: Medicines Take over-the-counter and prescription medicines only as told by your health care provider. Do not take any new medicines without talking to your provider. If you are taking blood thinners: Talk with your provider before taking aspirin or NSAIDs. These medicines can raise your risk of bleeding. Take your medicines as told. Take them at the same time each day. Do not do things that could hurt or bruise you. Be careful to avoid falls. Wear an alert bracelet or carry a card that says you take blood thinners. Lifestyle Eat heart-healthy foods. Talk with a dietitian to make an eating plan that is right for you. Do not use any products that  contain nicotine or tobacco. These products include cigarettes, chewing tobacco, and vaping devices, such as e-cigarettes. If you need help quitting, ask your  provider. Do not drink alcohol. Do not use drugs, including cannabis. Lose weight if you are overweight or obese. Exercise regularly as told by your provider. General instructions Do not use diet pills unless your provider approves. Diet pills may make heart problems worse. If you have obstructive sleep apnea, manage your condition as told. Keep all follow-up visits for continued treatment and for your safety. Contact a health care provider if: You notice a change in the rate, rhythm, or strength of your heartbeat. You are taking a blood thinner and you have more bruising. You have a sudden change in weight. You tire more easily when you exercise or do heavy work. Get help right away if you have: Pain or pressure in your chest. Shortness of breath. Fainting. Increasing sweating with no known cause. Side effects of blood thinners, such as blood in your vomit, stool, or pee (urine), or bleeding that you cannot stop. Any symptoms of a stroke. "BE FAST" is an easy way to remember the main warning signs of a stroke: B - Balance. Signs are dizziness, sudden trouble walking, or loss of balance. E - Eyes. Signs are trouble seeing or a sudden change in vision. F - Face. Signs are sudden weakness or numbness of the face, or the face or eyelid drooping on one side. A - Arms. Signs are weakness or numbness in an arm. This happens suddenly and usually on one side of the body. S - Speech. Signs are sudden trouble speaking, slurred speech, or trouble understanding what people say. T - Time. Time to call emergency services. Write down what time symptoms started. Other signs of a stroke, such as: A sudden, severe headache with no known cause. Nausea or vomiting. Seizure. These symptoms may be an emergency. Get help right away. Call 911. Do not wait to see if the symptoms will go away. Do not drive yourself to the hospital. This information is not intended to replace advice given to you by your  health care provider. Make sure you discuss any questions you have with your health care provider. Document Revised: 02/04/2022 Document Reviewed: 02/04/2022 Elsevier Patient Education  2023 [employer].

## 2022-10-16 NOTE — Assessment & Plan Note (Signed)
He is relatively asymptomatic.  Checking electrolytes today.  Recommend phlebotomy for elevated H&H.  He has this set up for next week. Adding metoprolol 25mg  twice per day.  Echo ordered and referral placed to cardiology.    CHA2DS2-VASc Score =   1.  Will hold off on anticoagulation at this time.

## 2022-10-17 LAB — CBC WITH DIFFERENTIAL/PLATELET
Absolute Monocytes: 517 cells/uL (ref 200–950)
Basophils Absolute: 61 cells/uL (ref 0–200)
Basophils Relative: 0.8 %
Eosinophils Absolute: 122 cells/uL (ref 15–500)
Eosinophils Relative: 1.6 %
HCT: 55.7 % — ABNORMAL HIGH (ref 38.5–50.0)
Hemoglobin: 18.8 g/dL — ABNORMAL HIGH (ref 13.2–17.1)
Lymphs Abs: 1125 cells/uL (ref 850–3900)
MCH: 32.2 pg (ref 27.0–33.0)
MCHC: 33.8 g/dL (ref 32.0–36.0)
MCV: 95.5 fL (ref 80.0–100.0)
MPV: 10.1 fL (ref 7.5–12.5)
Monocytes Relative: 6.8 %
Neutro Abs: 5776 cells/uL (ref 1500–7800)
Neutrophils Relative %: 76 %
Platelets: 224 10*3/uL (ref 140–400)
RBC: 5.83 10*6/uL — ABNORMAL HIGH (ref 4.20–5.80)
RDW: 12.4 % (ref 11.0–15.0)
Total Lymphocyte: 14.8 %
WBC: 7.6 10*3/uL (ref 3.8–10.8)

## 2022-10-17 LAB — BASIC METABOLIC PANEL
BUN/Creatinine Ratio: 16 (calc) (ref 6–22)
BUN: 23 mg/dL (ref 7–25)
CO2: 25 mmol/L (ref 20–32)
Calcium: 9.8 mg/dL (ref 8.6–10.3)
Chloride: 102 mmol/L (ref 98–110)
Creat: 1.47 mg/dL — ABNORMAL HIGH (ref 0.70–1.35)
Glucose, Bld: 119 mg/dL — ABNORMAL HIGH (ref 65–99)
Potassium: 3.7 mmol/L (ref 3.5–5.3)
Sodium: 141 mmol/L (ref 135–146)

## 2022-10-17 LAB — MAGNESIUM: Magnesium: 2.2 mg/dL (ref 1.5–2.5)

## 2022-10-18 ENCOUNTER — Encounter: Payer: Self-pay | Admitting: Family Medicine

## 2022-10-30 ENCOUNTER — Encounter (HOSPITAL_COMMUNITY): Payer: Self-pay | Admitting: Family Medicine

## 2022-11-12 ENCOUNTER — Other Ambulatory Visit: Payer: Self-pay | Admitting: Family Medicine

## 2022-12-01 ENCOUNTER — Ambulatory Visit (HOSPITAL_COMMUNITY): Payer: 59 | Attending: Family Medicine

## 2022-12-01 DIAGNOSIS — I483 Typical atrial flutter: Secondary | ICD-10-CM | POA: Insufficient documentation

## 2022-12-01 LAB — ECHOCARDIOGRAM COMPLETE
Area-P 1/2: 4.49 cm2
MV M vel: 4.76 m/s
MV Peak grad: 90.4 mmHg
MV VTI: 1.32 cm2
Radius: 0.95 cm
S' Lateral: 2.8 cm

## 2022-12-01 MED ORDER — PERFLUTREN LIPID MICROSPHERE
1.0000 mL | INTRAVENOUS | Status: AC | PRN
Start: 2022-12-01 — End: 2022-12-01
  Administered 2022-12-01: 2 mL via INTRAVENOUS

## 2022-12-04 MED ORDER — VERAPAMIL HCL 2.5 MG/ML IV SOLN
INTRAVENOUS | Status: AC
  Filled 2022-12-04: qty 2

## 2022-12-04 MED ORDER — HEPARIN SODIUM (PORCINE) 1000 UNIT/ML IJ SOLN
INTRAMUSCULAR | Status: AC
  Filled 2022-12-04: qty 10

## 2022-12-04 MED ORDER — LIDOCAINE HCL (PF) 1 % IJ SOLN
INTRAMUSCULAR | Status: AC
  Filled 2022-12-04: qty 30

## 2022-12-04 MED ORDER — FENTANYL CITRATE (PF) 100 MCG/2ML IJ SOLN
INTRAMUSCULAR | Status: AC
  Filled 2022-12-04: qty 2

## 2022-12-04 MED ORDER — MIDAZOLAM HCL 2 MG/2ML IJ SOLN
INTRAMUSCULAR | Status: AC
  Filled 2022-12-04: qty 2

## 2022-12-12 ENCOUNTER — Other Ambulatory Visit: Payer: Self-pay | Admitting: Family Medicine

## 2022-12-16 ENCOUNTER — Encounter: Payer: Self-pay | Admitting: Sports Medicine

## 2022-12-16 NOTE — Progress Notes (Signed)
Referring-Cody Ashley Royalty, DO Reason for referral-atrial flutter  HPI: 62 year old male for evaluation of atrial flutter at request of Everrett Coombe, DO.  Calcium score January 2024 47.5 which was 54th percentile.  There is also note of coronary calcification.  Echocardiogram June 2024 showed normal LV function, mild left ventricular hypertrophy, moderate left atrial enlargement, moderate to severe mitral regurgitation.  Noted to be in atrial flutter Oct 16, 2022.  TSH January 2024 0.76.  Laboratories May 2024 showed hemoglobin 18.8 which is chronically elevated, creatinine 1.47.  Cardiology now asked to evaluate.  Patient describes fatigue and dyspnea on exertion for approximately 1 year.  He states he was told 1 year ago that his heart rate was elevated.  There is no orthopnea, PND, pedal edema, chest pain or syncope.  Occasional palpitations.  Cardiology now asked to evaluate.  Current Outpatient Medications  Medication Sig Dispense Refill   AMBULATORY NON FORMULARY MEDICATION 3ml syringe, 21g 1.5 inch needles and 18 gauge blunt filler needles for testosterone injections every 2 weeks. Disp qs x 3 months. 1 each 0   AMBULATORY NON FORMULARY MEDICATION Send 30 day CPAP report.  [facility] 1 each 0   amLODipine (NORVASC) 10 MG tablet TAKE 1 TABLET BY MOUTH EVERY DAY 90 tablet 1   anastrozole (ARIMIDEX) 1 MG tablet TAKE 1 TABLET BY MOUTH EVERY OTHER DAY 45 tablet 1   BD DISP NEEDLES 20G X 1" MISC USE AS DIRECTED TO ADMINISTER TESTOSTERONE.*NOT COVERED BY INSURANCE     Candesartan Cilexetil-HCTZ 32-25 MG TABS TAKE 1 TABLET BY MOUTH EVERY DAY 90 tablet 1   escitalopram (LEXAPRO) 10 MG tablet TAKE 1 TABLET (10 MG TOTAL) BY MOUTH DAILY. TAKE 10MG  (1 TABLET) DAILY. 90 tablet 0   meloxicam (MOBIC) 15 MG tablet TAKE 1 TAB BY MOUTH EVERY 24 HOURS WITH A MEAL X2 WEEKS, THEN ONCE EVERY 24 HOURS AS NEEDED FOR PAIN 30 tablet 3   metoprolol tartrate (LOPRESSOR) 25 MG tablet Take 1 tablet (25 mg total) by mouth 2  (two) times daily. 180 tablet 0   Multiple Vitamin (MULTIVITAMIN) tablet Take 1 tablet by mouth daily.     NEEDLE, DISP, 18 G (BD HYPODERMIC NEEDLE) 18G X 1" MISC USE AS DIRECTED TO ADMINISTER TESTOSTERONE.*NOT COVERED BY INSURANCE 100 each 0   rosuvastatin (CRESTOR) 20 MG tablet Take 1 tablet (20 mg total) by mouth daily. 90 tablet 3   sildenafil (VIAGRA) 100 MG tablet TAKE 0.5-1 TABLETS BY MOUTH DAILY AS NEEDED FOR ERECTILE DYSFUNCTION. 30 tablet 11   SYRINGE-NEEDLE, DISP, 3 ML (B-D 3CC LUER-LOK SYR 21GX1-1/2) 21G X 1-1/2" 3 ML MISC USE AS DIRECTED TO ADMINISTER TESTOSTERONE. 50 each 0   testosterone cypionate (DEPOTESTOSTERONE CYPIONATE) 200 MG/ML injection INJECT 0.5 MLS (100 MG TOTAL) INTO THE MUSCLE ONCE A WEEK. 4 mL 0   traMADol (ULTRAM) 50 MG tablet Take 50 mg by mouth every 6 (six) hours as needed.     vitamin C (ASCORBIC ACID) 250 MG tablet Take 250 mg by mouth daily.     WELLNESS PROTEIN SHAKE PO Take by mouth.     No current facility-administered medications for this visit.    Allergies  Allergen Reactions   Lisinopril Cough    Mild cough with lisinopril     Past Medical History:  Diagnosis Date   Hyperlipidemia    Hypertension    Median nerve injury    Melanoma of skin (HCC) 10/26/2018   Skin biopsy dermatology May 2020   OSA (  obstructive sleep apnea) 09/11/2016   Uses CPAP every night    Ulnar nerve damage, right, initial encounter     Past Surgical History:  Procedure Laterality Date   BACK SURGERY     CARPAL TUNNEL RELEASE     CARPAL TUNNEL WITH CUBITAL TUNNEL     HERNIA REPAIR     left  and right   INCISION AND DRAINAGE Left 09/22/2018   Procedure: INCISION AND DRAINAGE LEFT ELBOW SEROMA;  Surgeon: Dairl Ponder, MD;  Location: [facility];  Service: Orthopedics;  Laterality: Left;   TOTAL HIP ARTHROPLASTY      Social History   Socioeconomic History   Marital status: Married    Spouse name: Not on file   Number of children: 0    Years of education: 16   Highest education level: Bachelor's degree (e.g., BA, AB, BS)  Occupational History   Occupation: Academic librarian coatings  Tobacco Use   Smoking status: Never   Smokeless tobacco: Never  Vaping Use   Vaping Use: Never used  Substance and Sexual Activity   Alcohol use: Yes    Comment: few beers on weekend   Drug use: No   Sexual activity: Not on file  Other Topics Concern   Not on file  Social History Narrative   No children   Drinks caffeine   Two story home   Right handed    Social Determinants of Health   Financial Resource Strain: Low Risk  (12/22/2022)   Overall Financial Resource Strain (CARDIA)    Difficulty of Paying Living Expenses: Not hard at all  Food Insecurity: No Food Insecurity (12/22/2022)   Hunger Vital Sign    Worried About Running Out of Food in the Last Year: Never true    Ran Out of Food in the Last Year: Never true  Transportation Needs: No Transportation Needs (12/22/2022)   PRAPARE - Administrator, Civil Service (Medical): No    Lack of Transportation (Non-Medical): No  Physical Activity: Sufficiently Active (12/22/2022)   Exercise Vital Sign    Days of Exercise per Week: 4 days    Minutes of Exercise per Session: 60 min  Stress: No Stress Concern Present (12/22/2022)   [name] - Occupational Stress Questionnaire    Feeling of Stress : Only a little  Social Connections: Socially Integrated (12/22/2022)   Social Connection and Isolation Panel [NHANES]    Frequency of Communication with Friends and Family: More than three times a week    Frequency of Social Gatherings with Friends and Family: More than three times a week    Attends Religious Services: 1 to 4 times per year    Active Member of [employer] or Organizations: Yes    Attends Engineer, structural: More than 4 times per year    Marital Status: Married  Catering manager Violence: Not on file    Family History  Problem Relation  Age of Onset   Hypertension Mother    Heart attack Father    Heart disease Father    Hypertension Father     ROS: no fevers or chills, productive cough, hemoptysis, dysphasia, odynophagia, melena, hematochezia, dysuria, hematuria, rash, seizure activity, orthopnea, PND, pedal edema, claudication. Remaining systems are negative.  Physical Exam:   Blood pressure (!) 140/80, pulse (!) 137, height 5\' 8"  (1.727 m), weight 243 lb 4.8 oz (110.4 kg), SpO2 98 %.  General:  Well developed/well nourished in NAD Skin warm/dry Patient  not depressed No peripheral clubbing Back-normal HEENT-normal/normal eyelids Neck supple/normal carotid upstroke bilaterally; no bruits; no JVD; no thyromegaly chest - CTA/ normal expansion CV -tachycardic and regular, no murmurs, rubs or gallops;  PMI nondisplaced Abdomen -NT/ND, no HSM, no mass, + bowel sounds, no bruit 2+ femoral pulses, no bruits Ext-trace edema, no chords, 2+ DP Neuro-grossly nonfocal  ECG -Oct 16, 2022 typical atrial flutter, cannot rule out septal infarct.  Personally reviewed  EKG Interpretation Date/Time:  Wednesday December 24 2022 14:59:14 EDT Ventricular Rate:  137 PR Interval:    QRS Duration:  94 QT Interval:  258 QTC Calculation: 389 R Axis:   263  Text Interpretation: Atrial flutter with 2:1 A-V conduction Right superior axis deviation Incomplete right bundle branch block Right ventricular hypertrophy Anteroseptal infarct , age undetermined When compared with ECG of 22-Sep-2018 07:34, Significant changes have occurred Confirmed by Olga Millers ([id number]) on 12/24/2022 3:01:25 PM    A/P  1 atrial flutter-patient is in typical atrial flutter.  His heart rate is elevated but did not take his metoprolol this morning.  He states when he takes his metoprolol it is typically controlled.  Will continue present dose.  Embolic risk factors include hypertension and coronary calcification.  CHA2DS2-VASc 2.  Begin apixaban 5 mg twice daily.   Will plan to proceed with TEE guided cardioversion.  We will then refer to electrophysiology for consideration of ablation.  2 mitral regurgitation-moderate to severe on recent transthoracic echocardiogram.  I will arrange a TEE as outlined above both for atrial flutter and also to assess mitral valve morphology and severity of mitral regurgitation.  Question if he would need mitral valve repair.  3 coronary calcification-patient denies chest pain.  Will increase Crestor to 40 mg daily.  Check lipids and liver in 8 weeks.  4 hypertension-blood pressure is borderline.  We will follow and advance medications if needed.  5 obstructive sleep apnea-continue CPAP.  Olga Millers, MD

## 2022-12-16 NOTE — H&P (View-Only) (Signed)
 Referring-Glen Ashley Royalty, DO Reason for referral-atrial flutter  HPI: 62 year old male for evaluation of atrial flutter at request of Everrett Coombe, DO.  Calcium score January 2024 47.5 which was 54th percentile.  There is also note of coronary calcification.  Echocardiogram June 2024 showed normal LV function, mild left ventricular hypertrophy, moderate left atrial enlargement, moderate to severe mitral regurgitation.  Noted to be in atrial flutter Oct 16, 2022.  TSH January 2024 0.76.  Laboratories May 2024 showed hemoglobin 18.8 which is chronically elevated, creatinine 1.47.  Cardiology now asked to evaluate.  Patient describes fatigue and dyspnea on exertion for approximately 1 year.  He states he was told 1 year ago that his heart rate was elevated.  There is no orthopnea, PND, pedal edema, chest pain or syncope.  Occasional palpitations.  Cardiology now asked to evaluate.  Current Outpatient Medications  Medication Sig Dispense Refill   AMBULATORY NON FORMULARY MEDICATION 3ml syringe, 21g 1.5 inch needles and 18 gauge blunt filler needles for testosterone injections every 2 weeks. Disp qs x 3 months. 1 each 0   AMBULATORY NON FORMULARY MEDICATION Send 30 day CPAP report.  [facility] 1 each 0   amLODipine (NORVASC) 10 MG tablet TAKE 1 TABLET BY MOUTH EVERY DAY 90 tablet 1   anastrozole (ARIMIDEX) 1 MG tablet TAKE 1 TABLET BY MOUTH EVERY OTHER DAY 45 tablet 1   BD DISP NEEDLES 20G X 1" MISC USE AS DIRECTED TO ADMINISTER TESTOSTERONE.*NOT COVERED BY INSURANCE     Candesartan Cilexetil-HCTZ 32-25 MG TABS TAKE 1 TABLET BY MOUTH EVERY DAY 90 tablet 1   escitalopram (LEXAPRO) 10 MG tablet TAKE 1 TABLET (10 MG TOTAL) BY MOUTH DAILY. TAKE 10MG  (1 TABLET) DAILY. 90 tablet 0   meloxicam (MOBIC) 15 MG tablet TAKE 1 TAB BY MOUTH EVERY 24 HOURS WITH A MEAL X2 WEEKS, THEN ONCE EVERY 24 HOURS AS NEEDED FOR PAIN 30 tablet 3   metoprolol tartrate (LOPRESSOR) 25 MG tablet Take 1 tablet (25 mg total) by mouth 2  (two) times daily. 180 tablet 0   Multiple Vitamin (MULTIVITAMIN) tablet Take 1 tablet by mouth daily.     NEEDLE, DISP, 18 G (BD HYPODERMIC NEEDLE) 18G X 1" MISC USE AS DIRECTED TO ADMINISTER TESTOSTERONE.*NOT COVERED BY INSURANCE 100 each 0   rosuvastatin (CRESTOR) 20 MG tablet Take 1 tablet (20 mg total) by mouth daily. 90 tablet 3   sildenafil (VIAGRA) 100 MG tablet TAKE 0.5-1 TABLETS BY MOUTH DAILY AS NEEDED FOR ERECTILE DYSFUNCTION. 30 tablet 11   SYRINGE-NEEDLE, DISP, 3 ML (B-D 3CC LUER-LOK SYR 21GX1-1/2) 21G X 1-1/2" 3 ML MISC USE AS DIRECTED TO ADMINISTER TESTOSTERONE. 50 each 0   testosterone cypionate (DEPOTESTOSTERONE CYPIONATE) 200 MG/ML injection INJECT 0.5 MLS (100 MG TOTAL) INTO THE MUSCLE ONCE A WEEK. 4 mL 0   traMADol (ULTRAM) 50 MG tablet Take 50 mg by mouth every 6 (six) hours as needed.     vitamin C (ASCORBIC ACID) 250 MG tablet Take 250 mg by mouth daily.     WELLNESS PROTEIN SHAKE PO Take by mouth.     No current facility-administered medications for this visit.    Allergies  Allergen Reactions   Lisinopril Cough    Mild cough with lisinopril     Past Medical History:  Diagnosis Date   Hyperlipidemia    Hypertension    Median nerve injury    Melanoma of skin (HCC) 10/26/2018   Skin biopsy dermatology May 2020   OSA (  obstructive sleep apnea) 09/11/2016   Uses CPAP every night    Ulnar nerve damage, right, initial encounter     Past Surgical History:  Procedure Laterality Date   BACK SURGERY     CARPAL TUNNEL RELEASE     CARPAL TUNNEL WITH CUBITAL TUNNEL     HERNIA REPAIR     left  and right   INCISION AND DRAINAGE Left 09/22/2018   Procedure: INCISION AND DRAINAGE LEFT ELBOW SEROMA;  Surgeon: Dairl Ponder, MD;  Location: [facility];  Service: Orthopedics;  Laterality: Left;   TOTAL HIP ARTHROPLASTY      Social History   Socioeconomic History   Marital status: Married    Spouse name: Not on file   Number of children: 0    Years of education: 16   Highest education level: Bachelor's degree (e.g., BA, AB, BS)  Occupational History   Occupation: Academic librarian coatings  Tobacco Use   Smoking status: Never   Smokeless tobacco: Never  Vaping Use   Vaping Use: Never used  Substance and Sexual Activity   Alcohol use: Yes    Comment: few beers on weekend   Drug use: No   Sexual activity: Not on file  Other Topics Concern   Not on file  Social History Narrative   No children   Drinks caffeine   Two story home   Right handed    Social Determinants of Health   Financial Resource Strain: Low Risk  (12/22/2022)   Overall Financial Resource Strain (CARDIA)    Difficulty of Paying Living Expenses: Not hard at all  Food Insecurity: No Food Insecurity (12/22/2022)   Hunger Vital Sign    Worried About Running Out of Food in the Last Year: Never true    Ran Out of Food in the Last Year: Never true  Transportation Needs: No Transportation Needs (12/22/2022)   PRAPARE - Administrator, Civil Service (Medical): No    Lack of Transportation (Non-Medical): No  Physical Activity: Sufficiently Active (12/22/2022)   Exercise Vital Sign    Days of Exercise per Week: 4 days    Minutes of Exercise per Session: 60 min  Stress: No Stress Concern Present (12/22/2022)   [name] - Occupational Stress Questionnaire    Feeling of Stress : Only a little  Social Connections: Socially Integrated (12/22/2022)   Social Connection and Isolation Panel [NHANES]    Frequency of Communication with Friends and Family: More than three times a week    Frequency of Social Gatherings with Friends and Family: More than three times a week    Attends Religious Services: 1 to 4 times per year    Active Member of [employer] or Organizations: Yes    Attends Engineer, structural: More than 4 times per year    Marital Status: Married  Catering manager Violence: Not on file    Family History  Problem Relation  Age of Onset   Hypertension Mother    Heart attack Father    Heart disease Father    Hypertension Father     ROS: no fevers or chills, productive cough, hemoptysis, dysphasia, odynophagia, melena, hematochezia, dysuria, hematuria, rash, seizure activity, orthopnea, PND, pedal edema, claudication. Remaining systems are negative.  Physical Exam:   Blood pressure (!) 140/80, pulse (!) 137, height 5\' 8"  (1.727 m), weight 243 lb 4.8 oz (110.4 kg), SpO2 98 %.  General:  Well developed/well nourished in NAD Skin warm/dry Patient  not depressed No peripheral clubbing Back-normal HEENT-normal/normal eyelids Neck supple/normal carotid upstroke bilaterally; no bruits; no JVD; no thyromegaly chest - CTA/ normal expansion CV -tachycardic and regular, no murmurs, rubs or gallops;  PMI nondisplaced Abdomen -NT/ND, no HSM, no mass, + bowel sounds, no bruit 2+ femoral pulses, no bruits Ext-trace edema, no chords, 2+ DP Neuro-grossly nonfocal  ECG -Oct 16, 2022 typical atrial flutter, cannot rule out septal infarct.  Personally reviewed  EKG Interpretation Date/Time:  Wednesday December 24 2022 14:59:14 EDT Ventricular Rate:  137 PR Interval:    QRS Duration:  94 QT Interval:  258 QTC Calculation: 389 R Axis:   263  Text Interpretation: Atrial flutter with 2:1 A-V conduction Right superior axis deviation Incomplete right bundle branch block Right ventricular hypertrophy Anteroseptal infarct , age undetermined When compared with ECG of 22-Sep-2018 07:34, Significant changes have occurred Confirmed by Olga Millers ([id number]) on 12/24/2022 3:01:25 PM    A/P  1 atrial flutter-patient is in typical atrial flutter.  His heart rate is elevated but did not take his metoprolol this morning.  He states when he takes his metoprolol it is typically controlled.  Will continue present dose.  Embolic risk factors include hypertension and coronary calcification.  CHA2DS2-VASc 2.  Begin apixaban 5 mg twice daily.   Will plan to proceed with TEE guided cardioversion.  We will then refer to electrophysiology for consideration of ablation.  2 mitral regurgitation-moderate to severe on recent transthoracic echocardiogram.  I will arrange a TEE as outlined above both for atrial flutter and also to assess mitral valve morphology and severity of mitral regurgitation.  Question if he would need mitral valve repair.  3 coronary calcification-patient denies chest pain.  Will increase Crestor to 40 mg daily.  Check lipids and liver in 8 weeks.  4 hypertension-blood pressure is borderline.  We will follow and advance medications if needed.  5 obstructive sleep apnea-continue CPAP.  Olga Millers, MD

## 2022-12-23 ENCOUNTER — Other Ambulatory Visit (INDEPENDENT_AMBULATORY_CARE_PROVIDER_SITE_OTHER): Payer: 59

## 2022-12-23 ENCOUNTER — Ambulatory Visit (INDEPENDENT_AMBULATORY_CARE_PROVIDER_SITE_OTHER): Payer: 59 | Admitting: Sports Medicine

## 2022-12-23 DIAGNOSIS — M1711 Unilateral primary osteoarthritis, right knee: Secondary | ICD-10-CM | POA: Diagnosis not present

## 2022-12-23 NOTE — Assessment & Plan Note (Signed)
Pleasant 62 year old male, we did Euflexxa back in 2023, he gets occasional effusions, known arthritis and meniscal tearing. Last aspiration injection was March 2024, recurrent effusion, repeat aspiration and injection today, return as needed.

## 2022-12-23 NOTE — Progress Notes (Signed)
    Procedures performed today:    Procedure: Real-time Ultrasound Guided aspiration and injection of right knee Device: Samsung HS60  Verbal informed consent obtained.  Time-out conducted.  Noted no overlying erythema, induration, or other signs of local infection.  Skin prepped in a sterile fashion.  Local anesthesia: Topical Ethyl chloride.  With sterile technique and under real time ultrasound guidance: Using an 18-gauge needle advanced to the suprapatellar recess and aspirated 22 mL of clear, straw-colored fluid, 1 cc kenalog 40, 2 cc lidocaine, 2 cc bupivacaine injected easily. Completed without difficulty  Advised to call if fevers/chills, erythema, induration, drainage, or persistent bleeding.  Images permanently stored and available for review in PACS.  Impression: Technically successful ultrasound guided therapeutic arthrocentesis and injection.  Independent interpretation of notes and tests performed by another provider:   None.  Brief History, Exam, Impression, and Recommendations:    Primary osteoarthritis of right knee Pleasant 62 year old male, we did Euflexxa back in 2023, he gets occasional effusions, known arthritis and meniscal tearing. Last aspiration injection was March 2024, recurrent effusion, repeat aspiration and injection today, return as needed.    ____________________________________________ Ihor Austin. Benjamin Stain, M.D., ABFM., CAQSM., AME. Primary Care and Sports Medicine [facility]  Adjunct Professor of Family Medicine  [employer] [facility] [employer]  Restaurant manager, fast food

## 2022-12-24 ENCOUNTER — Ambulatory Visit (INDEPENDENT_AMBULATORY_CARE_PROVIDER_SITE_OTHER): Payer: 59 | Admitting: Cardiology

## 2022-12-24 ENCOUNTER — Encounter: Payer: Self-pay | Admitting: Cardiology

## 2022-12-24 VITALS — BP 140/80 | HR 137 | Ht 68.0 in | Wt 243.3 lb

## 2022-12-24 DIAGNOSIS — E782 Mixed hyperlipidemia: Secondary | ICD-10-CM

## 2022-12-24 DIAGNOSIS — I10 Essential (primary) hypertension: Secondary | ICD-10-CM

## 2022-12-24 DIAGNOSIS — I483 Typical atrial flutter: Secondary | ICD-10-CM | POA: Diagnosis not present

## 2022-12-24 MED ORDER — ROSUVASTATIN CALCIUM 40 MG PO TABS
40.0000 mg | ORAL_TABLET | Freq: Every day | ORAL | 3 refills | Status: DC
Start: 2022-12-24 — End: 2024-01-19

## 2022-12-24 MED ORDER — APIXABAN 5 MG PO TABS
5.0000 mg | ORAL_TABLET | Freq: Two times a day (BID) | ORAL | 6 refills | Status: DC
Start: 2022-12-24 — End: 2023-01-08

## 2022-12-24 NOTE — Patient Instructions (Signed)
Medication Instructions:   START ELIQUIS 5 MG ONE TABLET TWICE DAILY  INCREASE ROSUVASTATIN TO 40 MG ONCE DAILY = 2 OF THE 20 MG TABLETS ONCE DAILY  *If you need a refill on your cardiac medications before your next appointment, please call your pharmacy*   Lab Work:  Your physician recommends that you return for lab work FASTING IN 8 WEEKS  If you have labs (blood work) drawn today and your tests are completely normal, you will receive your results only by: MyChart Message (if you have MyChart) OR A paper copy in the mail If you have any lab test that is abnormal or we need to change your treatment, we will call you to review the results.   Testing/Procedures:    You are scheduled for a TEE (Transesophageal Echocardiogram) Guided Cardioversion on Monday, July 15 with Dr. Rennis Golden.  Please arrive at the [facility] (Main Entrance A) at [facility]: [address] [place], [place] [address] at 9:00 AM (This time is 1 hour(s) before your procedure to ensure your preparation). Free valet parking service is available. You will check in at ADMITTING. The support person will be asked to wait in the waiting room.  It is OK to have someone drop you off and come back when you are ready to be discharged.     DIET:  Nothing to eat or drink after midnight except a sip of water with medications (see medication instructions below)  DO NOT TAKE METOPROLOL THE MORNING OF THE PROCEDURE-YOU MAY TAKE ALL OTHER MEDICATIONS WITH SIPS OF WATER.   LABS: TODAY  FYI:  For your safety, and to allow [place] to monitor your vital signs accurately during the surgery/procedure we request: If you have artificial nails, gel coating, SNS etc, please have those removed prior to your surgery/procedure. Not having the nail coverings /polish removed may result in cancellation or delay of your surgery/procedure.  You must have a responsible person to drive you home and stay in the waiting area during your procedure.  Failure to do so could result in cancellation.  Bring your insurance cards.  *Special Note: Every effort is made to have your procedure done on time. Occasionally there are emergencies that occur at the hospital that may cause delays. Please be patient if a delay does occur.       Follow-Up: At [facility], you and your health needs are our priority.  As part of our continuing mission to provide you with exceptional heart care, we have created designated Provider Care Teams.  These Care Teams include your primary Cardiologist (physician) and Advanced Practice Providers (APPs -  Physician Assistants and Nurse Practitioners) who all work together to provide you with the care you need, when you need it.  We recommend signing up for the patient portal called "MyChart".  Sign up information is provided on this After Visit Summary.  MyChart is used to connect with patients for Virtual Visits (Telemedicine).  Patients are able to view lab/test results, encounter notes, upcoming appointments, etc.  Non-urgent messages can be sent to your provider as well.   To learn more about what you can do with MyChart, go to [email or web address].    Your next appointment:   3 month(s)  Provider:   Olga Millers, MD

## 2022-12-25 LAB — CBC
Hematocrit: 52.8 % — ABNORMAL HIGH (ref 37.5–51.0)
Hemoglobin: 18.2 g/dL — ABNORMAL HIGH (ref 13.0–17.7)
MCH: 32.3 pg (ref 26.6–33.0)
MCHC: 34.5 g/dL (ref 31.5–35.7)
MCV: 94 fL (ref 79–97)
Platelets: 207 10*3/uL (ref 150–450)
RBC: 5.63 x10E6/uL (ref 4.14–5.80)
RDW: 12.8 % (ref 11.6–15.4)
WBC: 18.2 10*3/uL — ABNORMAL HIGH (ref 3.4–10.8)

## 2022-12-25 LAB — BASIC METABOLIC PANEL
BUN/Creatinine Ratio: 18 (ref 10–24)
BUN: 25 mg/dL (ref 8–27)
CO2: 19 mmol/L — ABNORMAL LOW (ref 20–29)
Calcium: 9.6 mg/dL (ref 8.6–10.2)
Chloride: 99 mmol/L (ref 96–106)
Creatinine, Ser: 1.4 mg/dL — ABNORMAL HIGH (ref 0.76–1.27)
Glucose: 102 mg/dL — ABNORMAL HIGH (ref 70–99)
Potassium: 4.5 mmol/L (ref 3.5–5.2)
Sodium: 138 mmol/L (ref 134–144)
eGFR: 57 mL/min/{1.73_m2} — ABNORMAL LOW (ref >59)

## 2022-12-26 ENCOUNTER — Other Ambulatory Visit: Payer: Self-pay | Admitting: Family Medicine

## 2022-12-26 DIAGNOSIS — D72829 Elevated white blood cell count, unspecified: Secondary | ICD-10-CM

## 2022-12-26 NOTE — Pre-Procedure Instructions (Signed)
Spoke to patient on phone regarding procedure Monday.  Instructed to arrive at 8:15 am, NPO after midnight  Confirmed patient has a ride home and responsible person to stay with patient for 24 hours after the procedure  Confirmed no missed doses of Eliquis, instructed patient to take morning of procedure with a sip of water.  Take BP meds in the AM with a sip of water

## 2022-12-26 NOTE — Progress Notes (Signed)
Ok to wait until 6 weeks.   Thanks!  CM

## 2022-12-28 NOTE — Anesthesia Preprocedure Evaluation (Addendum)
Anesthesia Evaluation  Patient identified by MRN, date of birth, ID band Patient awake    Reviewed: Allergy & Precautions, NPO status , Patient's Chart, lab work & pertinent test results  Airway Mallampati: II  TM Distance: >3 FB Neck ROM: Full    Dental  (+) Dental Advisory Given, Teeth Intact,    Pulmonary sleep apnea and Continuous Positive Airway Pressure Ventilation , neg recent URI   Pulmonary exam normal breath sounds clear to auscultation       Cardiovascular hypertension, Pt. on medications and Pt. on home beta blockers Normal cardiovascular exam+ Valvular Problems/Murmurs MR  Rhythm:Regular Rate:Normal  Echo 11/2022  1. Left ventricular ejection fraction, by estimation, is 60 to 65%. The left ventricle has normal function. The left ventricle has no regional wall motion abnormalities. There is mild concentric left ventricular hypertrophy. Left ventricular diastolic parameters are indeterminate.   2. Right ventricular systolic function is normal. The right ventricular size is normal. There is normal pulmonary artery systolic pressure. The estimated right ventricular systolic pressure is 24.6 mmHg.   3. Left atrial size was moderately dilated.   4. The mitral valve is degenerative. There is moderate-to-severe central mitral valve regurgitation. EROA 0.3cm2, RVol 42mL.   5. The aortic valve is tricuspid. There is mild calcification of the aortic valve. There is mild thickening of the aortic valve. Aortic valve regurgitation is not visualized. Aortic valve sclerosis/calcification is present, without any evidence of aortic stenosis.   6. The inferior vena cava is dilated in size with >50% respiratory variability, suggesting right atrial pressure of 8 mmHg.    TTE 2018 - normal LV systolic function; mild LVH; mild   diastolic dysfunction; elevated LV filling pressure; mild MR.   Neuro/Psych  PSYCHIATRIC DISORDERS Anxiety       Neuromuscular disease    GI/Hepatic negative GI ROS, Neg liver ROS,,,  Endo/Other  negative endocrine ROS    Renal/GU negative Renal ROS     Musculoskeletal  (+) Arthritis ,    Abdominal  (+) + obese  Peds  Hematology negative hematology ROS (+)   Anesthesia Other Findings   Reproductive/Obstetrics                             Anesthesia Physical Anesthesia Plan  ASA: 3  Anesthesia Plan: MAC   Post-op Pain Management: Minimal or no pain anticipated   Induction: Intravenous  PONV Risk Score and Plan: 2 and Ondansetron, Propofol infusion, TIVA and Treatment may vary due to age or medical condition  Airway Management Planned: Natural Airway  Additional Equipment:   Intra-op Plan:   Post-operative Plan:   Informed Consent: I have reviewed the patients History and Physical, chart, labs and discussed the procedure including the risks, benefits and alternatives for the proposed anesthesia with the patient or authorized representative who has indicated his/her understanding and acceptance.     Dental advisory given  Plan Discussed with: CRNA  Anesthesia Plan Comments:         Anesthesia Quick Evaluation

## 2022-12-29 ENCOUNTER — Ambulatory Visit (HOSPITAL_COMMUNITY): Payer: 59 | Admitting: Anesthesiology

## 2022-12-29 ENCOUNTER — Encounter (HOSPITAL_COMMUNITY): Payer: Self-pay | Admitting: Internal Medicine

## 2022-12-29 ENCOUNTER — Other Ambulatory Visit: Payer: Self-pay

## 2022-12-29 ENCOUNTER — Encounter (HOSPITAL_COMMUNITY)
Admission: RE | Disposition: A | Discharge status: Home / Self Care | Payer: Self-pay | Source: Home / Self Care | Attending: Internal Medicine

## 2022-12-29 ENCOUNTER — Ambulatory Visit (HOSPITAL_COMMUNITY): Payer: 59

## 2022-12-29 ENCOUNTER — Ambulatory Visit (HOSPITAL_COMMUNITY)
Admission: RE | Admit: 2022-12-29 | Discharge: 2022-12-29 | Disposition: A | Discharge status: Home / Self Care | Payer: 59 | Attending: Internal Medicine | Admitting: Internal Medicine

## 2022-12-29 DIAGNOSIS — I4892 Unspecified atrial flutter: Secondary | ICD-10-CM | POA: Diagnosis not present

## 2022-12-29 DIAGNOSIS — I10 Essential (primary) hypertension: Secondary | ICD-10-CM

## 2022-12-29 DIAGNOSIS — I2510 Atherosclerotic heart disease of native coronary artery without angina pectoris: Secondary | ICD-10-CM | POA: Insufficient documentation

## 2022-12-29 DIAGNOSIS — G4733 Obstructive sleep apnea (adult) (pediatric): Secondary | ICD-10-CM | POA: Diagnosis not present

## 2022-12-29 DIAGNOSIS — I340 Nonrheumatic mitral (valve) insufficiency: Secondary | ICD-10-CM

## 2022-12-29 DIAGNOSIS — E785 Hyperlipidemia, unspecified: Secondary | ICD-10-CM | POA: Diagnosis not present

## 2022-12-29 DIAGNOSIS — Z79899 Other long term (current) drug therapy: Secondary | ICD-10-CM | POA: Insufficient documentation

## 2022-12-29 DIAGNOSIS — R9431 Abnormal electrocardiogram [ECG] [EKG]: Secondary | ICD-10-CM

## 2022-12-29 DIAGNOSIS — I483 Typical atrial flutter: Secondary | ICD-10-CM | POA: Insufficient documentation

## 2022-12-29 DIAGNOSIS — I119 Hypertensive heart disease without heart failure: Secondary | ICD-10-CM | POA: Insufficient documentation

## 2022-12-29 DIAGNOSIS — R0609 Other forms of dyspnea: Secondary | ICD-10-CM | POA: Diagnosis not present

## 2022-12-29 HISTORY — PX: CARDIOVERSION: SHX1299

## 2022-12-29 HISTORY — PX: TEE WITHOUT CARDIOVERSION: SHX5443

## 2022-12-29 LAB — ECHO TEE
MV M vel: 4.17 m/s
MV Peak grad: 69.6 mmHg

## 2022-12-29 SURGERY — ECHOCARDIOGRAM, TRANSESOPHAGEAL
Anesthesia: Monitor Anesthesia Care

## 2022-12-29 MED ORDER — SODIUM CHLORIDE 0.9 % IV SOLN: INTRAVENOUS | Status: DC

## 2022-12-29 MED ORDER — PHENYLEPHRINE 80 MCG/ML (10ML) SYRINGE FOR IV PUSH (FOR BLOOD PRESSURE SUPPORT)
PREFILLED_SYRINGE | INTRAVENOUS | Status: DC | PRN
  Administered 2022-12-29: 80 ug via INTRAVENOUS

## 2022-12-29 MED ORDER — PROPOFOL 500 MG/50ML IV EMUL
INTRAVENOUS | Status: DC | PRN
  Administered 2022-12-29: 150 ug/kg/min via INTRAVENOUS

## 2022-12-29 MED ORDER — LIDOCAINE 2% (20 MG/ML) 5 ML SYRINGE
INTRAMUSCULAR | Status: DC | PRN
  Administered 2022-12-29: 100 mg via INTRAVENOUS

## 2022-12-29 SURGICAL SUPPLY — 1 items: ELECT DEFIB PAD ADLT CADENCE (PAD) ×1 IMPLANT

## 2022-12-29 NOTE — Transfer of Care (Signed)
Immediate Anesthesia Transfer of Care Note  Patient: Glen Chambers  Procedure(s) Performed: TRANSESOPHAGEAL ECHOCARDIOGRAM CARDIOVERSION  Patient Location:  CPU  Anesthesia Type:MAC  Level of Consciousness: awake, alert , and oriented  Airway & Oxygen Therapy: Patient Spontanous Breathing and Patient connected to nasal cannula oxygen  Post-op Assessment: Report given to RN and Post -op Vital signs reviewed and stable  Post vital signs: Reviewed and stable  Last Vitals:  Vitals Value Taken Time  BP    Temp    Pulse    Resp    SpO2      Last Pain:  Vitals:   12/29/22 0819  TempSrc: Temporal  PainSc: 0-No pain         Complications: No notable events documented.

## 2022-12-29 NOTE — Interval H&P Note (Signed)
History and Physical Interval Note:  12/29/2022 8:25 AM  Glen Chambers  has presented today for surgery, with the diagnosis of ATRIAL FLUTTER.  The various methods of treatment have been discussed with the patient and family. After consideration of risks, benefits and other options for treatment, the patient has consented to  Procedure(s): TRANSESOPHAGEAL ECHOCARDIOGRAM (N/A) CARDIOVERSION (N/A) as a surgical intervention.  The patient's history has been reviewed, patient examined, no change in status, stable for surgery.  I have reviewed the patient's chart and labs.  Questions were answered to the patient's satisfaction.     Chrystie Nose

## 2022-12-29 NOTE — Anesthesia Postprocedure Evaluation (Signed)
Anesthesia Post Note  Patient: Glen Chambers  Procedure(s) Performed: TRANSESOPHAGEAL ECHOCARDIOGRAM CARDIOVERSION     Patient location during evaluation: PACU Anesthesia Type: MAC Level of consciousness: awake and alert Pain management: pain level controlled Vital Signs Assessment: post-procedure vital signs reviewed and stable Respiratory status: spontaneous breathing Cardiovascular status: stable Anesthetic complications: no   No notable events documented.  Last Vitals:  Vitals:   12/29/22 1020 12/29/22 1021  BP: (!) 90/58 (!) 90/58  Pulse: 64 67  Resp: 20 14  Temp:    SpO2: 97% 96%    Last Pain:  Vitals:   12/29/22 0819  TempSrc: Temporal  PainSc: 0-No pain                 Lewie Loron

## 2022-12-29 NOTE — CV Procedure (Addendum)
TEE/CARDIOVERSION NOTE  TRANSESOPHAGEAL ECHOCARDIOGRAM (TEE):  Indictation: Atrial flutter and mitral regurgitation  Consent:   Informed consent was obtained prior to the procedure. The risks, benefits and alternatives for the procedure were discussed and the patient comprehended these risks.  Risks include, but are not limited to, cough, sore throat, vomiting, nausea, somnolence, esophageal and stomach trauma or perforation, bleeding, low blood pressure, aspiration, pneumonia, infection, trauma to the teeth and death.    Time Out: Verified patient identification, verified procedure, site/side was marked, verified correct patient position, special equipment/implants available, medications/allergies/relevent history reviewed, required imaging and test results available. Performed  Procedure:  After a procedural time-out, the patient was given propofol per anesthesia for sedation. See their separate report for details. The patient's heart rate, blood pressure, and oxygen saturation are monitored continuously during the procedure. The oropharynx was anesthetized with topical cetacaine.  The transesophageal probe was inserted in the esophagus and stomach without difficulty and multiple views were obtained. Agitated microbubble saline contrast was not administered.  Complications:    Complications: None Patient did tolerate procedure well.  Findings:  LEFT VENTRICLE: The left ventricular wall thickness is mildly increased.  The left ventricular cavity is normal in size. Wall motion is globally hypokinetic.  LVEF is 45-50%.  RIGHT VENTRICLE:  The right ventricle is normal in structure and function without any thrombus or masses.    LEFT ATRIUM:  The left atrium is moderately dilated in size without any thrombus or masses.  There is not spontaneous echo contrast ("smoke") in the left atrium consistent with a low flow state.  LEFT ATRIAL APPENDAGE:  The left atrial appendage is free of  any thrombus or masses. The appendage has single lobes. Pulse doppler indicates low flow in the appendage.  ATRIAL SEPTUM:  The atrial septum appears intact and is free of thrombus and/or masses.  There is no evidence for interatrial shunting by color doppler.  RIGHT ATRIUM:  The right atrium is mildly dilated in size and function without any thrombus or masses.  MITRAL VALVE:  The mitral valve is normal in structure and function with Severe central regurgitation.  There were no vegetations or stenosis.  AORTIC VALVE:  The aortic valve is trileaflet, with calcification an fusion of the base of the left and right coronary cusps. There is  no  regurgitation.  There were no vegetations. No significant stenosis is appreciated.  TRICUSPID VALVE:  The tricuspid valve is normal in structure and function with  trivial  regurgitation.  There were no vegetations or stenosis   PULMONIC VALVE:  The pulmonic valve is normal in structure and function with  no  regurgitation.  There were no vegetations or stenosis.   AORTIC ARCH, ASCENDING AND DESCENDING AORTA:  There was no Myrtis Ser et. Al, 1992) atherosclerosis of the ascending aorta, aortic arch, or proximal descending aorta.  12. PULMONARY VEINS: Anomalous pulmonary venous return was not noted.  13. PERICARDIUM: The pericardium appeared normal and non-thickened.  There is no pericardial effusion.  CARDIOVERSION:     Second Time Out: Verified patient identification, verified procedure, site/side was marked, verified correct patient position, special equipment/implants available, medications/allergies/relevent history reviewed, required imaging and test results available.  Performed  Procedure:  Patient placed on cardiac monitor, pulse oximetry, supplemental oxygen as necessary.  Sedation administered per anesthesia Pacer pads placed anterior and posterior chest. Cardioverted 1 time(s).  Cardioverted at 150J  biphasic.  Complications:  Complications: None Patient did tolerate procedure well.  Impression:  No  LAA thrombus Negative for PFO by color doppler Severe functional (central) MR - which was noted to diminish to trivial MR after cardioversion by TTE Calcification and fusion of the base of the left and right coronary cusps without stenosis or regurgitation of the aortic valve. LVEF 45-50%, global hypokinesis, suspect tachycardia-mediated Successful DCCV with a single 150J biphasic shock to NSR  Recommendations:  Continue anticoagulation - follow-up with Dr. Jens Som. Severe sleep apnea noted during sedation - on CPAP  Time Spent Directly with the Patient:  45 minutes   Chrystie Nose, MD, [facility], FACP  [place]  [facility]  Medical Director of the [facility]  Cardiovascular Risk Reduction Clinic Diplomate of the American Board of Clinical Lipidology Attending Cardiologist  Direct Dial: [phone]  Fax: [phone]  Website:  [email or web address] Nicely Brittiany Wiehe 12/29/2022, 10:13 AM \

## 2022-12-30 ENCOUNTER — Encounter: Payer: Self-pay | Admitting: Cardiology

## 2022-12-30 ENCOUNTER — Encounter (HOSPITAL_COMMUNITY): Payer: Self-pay | Admitting: Internal Medicine

## 2022-12-30 DIAGNOSIS — I483 Typical atrial flutter: Secondary | ICD-10-CM

## 2022-12-31 ENCOUNTER — Ambulatory Visit: Payer: 59 | Admitting: Family Medicine

## 2023-01-08 MED ORDER — APIXABAN 5 MG PO TABS
5.0000 mg | ORAL_TABLET | Freq: Two times a day (BID) | ORAL | 1 refills | Status: DC
Start: 2023-01-08 — End: 2024-01-04

## 2023-01-08 NOTE — Telephone Encounter (Signed)
Prescription refill request for Eliquis received. Indication: A flutter Last office visit: 12/2022 Scr: 1.4 Age: 62 Weight: 110.4 kg  Continue with 5 mg bid

## 2023-01-13 ENCOUNTER — Other Ambulatory Visit: Payer: Self-pay | Admitting: Medical-Surgical

## 2023-01-13 NOTE — Telephone Encounter (Signed)
Last OV: 12/23/22 Next OV: no appt scheduled Last RF: 12/15/22

## 2023-01-26 ENCOUNTER — Encounter: Payer: Self-pay | Admitting: Family Medicine

## 2023-01-26 DIAGNOSIS — U071 COVID-19: Secondary | ICD-10-CM

## 2023-01-26 NOTE — Telephone Encounter (Signed)
Please see the MyChart message reply(ies) for my assessment and plan.    This patient gave consent for this Medical Advice Message and is aware that it may result in a bill to their insurance company, as well as the possibility of receiving a bill for a co-payment or deductible. They are an established patient, but are not seeking medical advice exclusively about a problem treated during an in person or video visit in the last seven days. I did not recommend an in person or video visit within seven days of my reply.    I spent a total of 6 minutes cumulative time within 7 days through MyChart messaging.  Cody Matthews, DO   

## 2023-01-28 ENCOUNTER — Encounter: Payer: Self-pay | Admitting: Cardiology

## 2023-01-29 ENCOUNTER — Other Ambulatory Visit: Payer: Self-pay

## 2023-01-29 ENCOUNTER — Other Ambulatory Visit: Payer: Self-pay | Admitting: Family Medicine

## 2023-01-29 DIAGNOSIS — I483 Typical atrial flutter: Secondary | ICD-10-CM

## 2023-01-29 MED ORDER — METOPROLOL TARTRATE 25 MG PO TABS: 25.0000 mg | ORAL_TABLET | Freq: Two times a day (BID) | ORAL | 3 refills | Status: DC

## 2023-01-31 ENCOUNTER — Other Ambulatory Visit: Payer: Self-pay | Admitting: Family Medicine

## 2023-01-31 DIAGNOSIS — I10 Essential (primary) hypertension: Secondary | ICD-10-CM

## 2023-02-02 ENCOUNTER — Other Ambulatory Visit: Payer: Self-pay | Admitting: Family Medicine

## 2023-02-03 ENCOUNTER — Ambulatory Visit: Payer: 59 | Attending: Cardiology | Admitting: Cardiology

## 2023-02-03 ENCOUNTER — Encounter: Payer: Self-pay | Admitting: Cardiology

## 2023-02-03 VITALS — BP 120/82 | HR 93 | Ht 69.0 in | Wt 227.0 lb

## 2023-02-03 DIAGNOSIS — D6869 Other thrombophilia: Secondary | ICD-10-CM

## 2023-02-03 DIAGNOSIS — G4733 Obstructive sleep apnea (adult) (pediatric): Secondary | ICD-10-CM | POA: Diagnosis not present

## 2023-02-03 DIAGNOSIS — I483 Typical atrial flutter: Secondary | ICD-10-CM | POA: Diagnosis not present

## 2023-02-03 DIAGNOSIS — I10 Essential (primary) hypertension: Secondary | ICD-10-CM

## 2023-02-03 NOTE — Patient Instructions (Signed)
Medication Instructions:  Your physician recommends that you continue on your current medications as directed. Please refer to the Current Medication list given to you today.  *If you need a refill on your cardiac medications before your next appointment, please call your pharmacy*   Lab Work: None ordered If you have labs (blood work) drawn today and your tests are completely normal, you will receive your results only by: MyChart Message (if you have MyChart) OR A paper copy in the mail If you have any lab test that is abnormal or we need to change your treatment, we will call you to review the results.   Testing/Procedures: Your physician has recommended that you have an ablation. Catheter ablation is a medical procedure used to treat some cardiac arrhythmias (irregular heartbeats). During catheter ablation, a long, thin, flexible tube is put into a blood vessel in your groin (upper thigh), or neck. This tube is called an ablation catheter. It is then guided to your heart through the blood vessel. Radio frequency waves destroy small areas of heart tissue where abnormal heartbeats may cause an arrhythmia to start. Please see the instruction sheet given to you today.  EP scheduler, April, will call you to schedule this procedure.   Follow-Up: At [facility], you and your health needs are our priority.  As part of our continuing mission to provide you with exceptional heart care, we have created designated Provider Care Teams.  These Care Teams include your primary Cardiologist (physician) and Advanced Practice Providers (APPs -  Physician Assistants and Nurse Practitioners) who all work together to provide you with the care you need, when you need it.  Your next appointment:   1 month(s) after your ablation  The format for your next appointment:   In Person  Provider:   Loman Brooklyn, MD{   Thank you for choosing [facility]!!   Dory Horn, RN [phone]  Other  Instructions  Cardiac Ablation Cardiac ablation is a procedure to destroy (ablate) heart tissue that is sending bad signals. These bad signals cause the heart to beat very fast or in a way that is not normal. Destroying some tissues can help make the heart rhythm normal. Tell your doctor about: Any allergies you have. All medicines you are taking. These include vitamins, herbs, eye drops, creams, and over-the-counter medicines. Any problems you or family members have had with anesthesia. Any bleeding problems you have. Any surgeries you have had. Any medical conditions you have. Whether you are pregnant or may be pregnant. What are the risks? Your doctor will talk with you about risks. These may include: Infection. Bruising and bleeding. Stroke or blood clots. Damage to nearby areas of your body. Allergies to medicines or dyes. Needing a pacemaker if the heart gets damaged. A pacemaker helps the heart beat normally. The procedure not working. What happens before the procedure? Medicines Ask your doctor about changing or stopping: Your normal medicines. Vitamins, herbs, and supplements. Over-the-counter medicines. Do not take aspirin or ibuprofen unless you are told to. General instructions Follow instructions from your doctor about what you may eat and drink. If you will be going home right after the procedure, plan to have a responsible adult: Take you home from the hospital or clinic. You will not be allowed to drive. Care for you for the time you are told. Ask your doctor what steps will be taken to prevent the spread of germs. What happens during the procedure?  An IV tube will be put  into one of your veins. You may be given: A sedative. This helps you relax. Anesthesia. This will: Numb certain areas of your body. The skin on your neck or groin will be numbed. A cut (incision) will be made in your neck or groin. A needle will be put through the cut and into a large  vein. The small, thin tube (catheter) will be put into the needle. The tube will be moved to your heart. A type of X-ray (fluoroscopy) will be used to help guide the tube. It will also show constant images of the heart on a screen. Dye may be put through the tube. This helps your doctor see your heart. An electric current will be sent from the tube to destroy heart tissue in certain areas. The tube will be taken out. Pressure will be held on your cut. This helps stop bleeding. A bandage (dressing) will be put over your cut. The procedure may vary among doctors and hospitals. What happens after the procedure? You will be monitored until you leave the hospital or clinic. This includes checking your blood pressure, heart rate and rhythm, breathing rate, and blood oxygen level. Your cut will be checked for bleeding. You will need to lie still for a few hours. If your groin was used, you will need to keep your leg straight for a few hours after the small, thin tube is removed. This information is not intended to replace advice given to you by your health care provider. Make sure you discuss any questions you have with your health care provider. Document Revised: 11/19/2021 Document Reviewed: 11/19/2021 Elsevier Patient Education  2024 [employer].

## 2023-02-03 NOTE — Progress Notes (Signed)
Electrophysiology Office Note:   Date:  02/03/2023  ID:  Glen Chambers, DOB 06-27-1960, MRN [id number]  Primary Cardiologist: None Electrophysiologist: None      History of Present Illness:   Glen Chambers is a 62 y.o. male with h/o atrial flutter seen today for  for Electrophysiology evaluation of atrial flutter at the request of Glen Chambers.      He was diagnosed with atrial flutter in May 2024.  He reports fatigue and dyspnea on exertion for approximately the last year.  His heart rate has been elevated as well.  There is no orthopnea, PND, edema.  He has no chest pain and no syncope.  He had a cardioversion 12/29/2022.  Immediately after the cardioversion, he felt much improved with less fatigue and shortness of breath.  He has gone back into atrial flutter with return of the symptoms.      Review of systems complete and found to be negative unless listed in HPI.   EP Information / Studies Reviewed:    EKG is ordered today. Personal review as below.  EKG Interpretation Date/Time:  Tuesday February 03 2023 14:42:07 EDT Ventricular Rate:  93 PR Interval:    QRS Duration:  114 QT Interval:  436 QTC Calculation: 542 R Axis:   251  Text Interpretation: Atrial flutter with variable A-V block Right superior axis deviation Incomplete right bundle branch block Inferior infarct (cited on or before 24-Dec-2022) Cannot rule out Anterior infarct , age undetermined ST & T wave abnormality, consider lateral ischemia Prolonged QT When compared with ECG of 29-Dec-2022 10:19, Atrial flutter Confirmed by Roland Lipke ([id number]) on 02/03/2023 2:50:50 PM     Risk Assessment/Calculations:    CHA2DS2-VASc Score = 1   This indicates a 0.6% annual risk of stroke. The patient's score is based upon: CHF History: 0 HTN History: 1 Diabetes History: 0 Stroke History: 0 Vascular Disease History: 1 Age Score: 0 Gender Score: 0              Physical Exam:   VS:  BP 120/82 (BP Location: Right Arm,  Patient Position: Sitting, Cuff Size: Large)   Pulse 93   Ht 5\' 9"  (1.753 m)   Wt 227 lb (103 kg)   SpO2 98%   BMI 33.52 kg/m    Wt Readings from Last 3 Encounters:  02/03/23 227 lb (103 kg)  12/29/22 220 lb (99.8 kg)  12/24/22 243 lb 4.8 oz (110.4 kg)     GEN: Well nourished, well developed in no acute distress NECK: No JVD; No carotid bruits CARDIAC: Irregularly irregular rate and rhythm, no murmurs, rubs, gallops RESPIRATORY:  Clear to auscultation without rales, wheezing or rhonchi  ABDOMEN: Soft, non-tender, non-distended EXTREMITIES:  No edema; No deformity   ASSESSMENT AND PLAN:    1.  Typical atrial flutter: Currently on metoprolol and Eliquis.  He feels poorly in atrial flutter with fatigue and shortness of breath.  He prefer a rhythm control strategy would like to avoid long-term medications.  Due to that, we Angelos Wasco plan for ablation.  Risks and benefits of discussed.  Risk of bleeding, tamponade, heart block, stroke, MI, renal failure, death.  He understands these risks and is agreed to the procedure.  2.  Coronary artery calcifications: Continue Crestor per primary cardiology  3.  Obstructive sleep apnea: CPAP compliance encouraged  4.  Secondary hypercoagulable state: Currently on Eliquis for atrial flutter  5.  Mitral regurgitation: Plan per primary cardiology  Follow up with Dr. Elberta Fortis as  usual post procedure  Signed, Zakiya Sporrer Jorja Loa, MD

## 2023-02-17 ENCOUNTER — Encounter: Payer: Self-pay | Admitting: Cardiology

## 2023-03-03 ENCOUNTER — Telehealth: Payer: Self-pay

## 2023-03-03 DIAGNOSIS — I483 Typical atrial flutter: Secondary | ICD-10-CM

## 2023-03-03 NOTE — Telephone Encounter (Signed)
Pt is scheduled for an Aflutter Ablation with Dr. Elberta Fortis on 10/17 at 1:00 PM. He will go to [facility] to have labs done.  I will send Instruction letter via MyChart per pt's request.

## 2023-03-03 NOTE — Addendum Note (Signed)
Addended by: Cleda Mccreedy on: 03/03/2023 02:52 PM   Modules accepted: Orders

## 2023-03-03 NOTE — Telephone Encounter (Signed)
LM for pt to call back to schedule his Aflutter ablation.

## 2023-03-04 NOTE — Progress Notes (Deleted)
HPI: FU atrial flutter. Calcium score January 2024 47.5 which was 54th percentile. There is also note of coronary calcification. Echocardiogram June 2024 showed normal LV function, mild left ventricular hypertrophy, moderate left atrial enlargement, moderate to severe mitral regurgitation. Noted to be in atrial flutter Oct 16, 2022. TSH January 2024 0.76.  Patient subsequently underwent TEE guided cardioversion on July 15; ejection fraction 45 to 50%, moderate left atrial enlargement without thrombus, mild right atrial enlargement, severe mitral regurgitation which decreased to trivial after cardioversion.  Patient subsequent seen by Dr. Elberta Fortis and atrial flutter ablation is planned.  Since last seen,   Current Outpatient Medications  Medication Sig Dispense Refill   AMBULATORY NON FORMULARY MEDICATION 3ml syringe, 21g 1.5 inch needles and 18 gauge blunt filler needles for testosterone injections every 2 weeks. Disp qs x 3 months. 1 each 0   AMBULATORY NON FORMULARY MEDICATION Send 30 day CPAP report.  [employer] 1 each 0   amLODipine (NORVASC) 10 MG tablet TAKE 1 TABLET BY MOUTH EVERY DAY 90 tablet 1   anastrozole (ARIMIDEX) 1 MG tablet TAKE 1 TABLET BY MOUTH EVERY OTHER DAY 45 tablet 1   apixaban (ELIQUIS) 5 MG TABS tablet Take 1 tablet (5 mg total) by mouth 2 (two) times daily. 180 tablet 1   BD DISP NEEDLES 20G X 1" MISC USE AS DIRECTED TO ADMINISTER TESTOSTERONE.*NOT COVERED BY INSURANCE     Candesartan Cilexetil-HCTZ 32-25 MG TABS TAKE 1 TABLET BY MOUTH EVERY DAY 90 tablet 1   Coenzyme Q10 (COQ10 PO) Take 1 capsule by mouth in the morning.     escitalopram (LEXAPRO) 10 MG tablet TAKE 1 TABLET (10 MG TOTAL) BY MOUTH DAILY. TAKE 10MG  (1 TABLET) DAILY. 90 tablet 0   ibuprofen (ADVIL) 200 MG tablet Take 200-400 mg by mouth every 8 (eight) hours as needed (pain.).     meloxicam (MOBIC) 15 MG tablet TAKE 1 TAB BY MOUTH EVERY 24 HOURS WITH A MEAL X2 WEEKS, THEN ONCE EVERY 24 HOURS AS NEEDED FOR PAIN  30 tablet 3   metoprolol tartrate (LOPRESSOR) 25 MG tablet Take 1 tablet (25 mg total) by mouth 2 (two) times daily. 180 tablet 3   Multiple Vitamin (MULTIVITAMIN) tablet Take 1 tablet by mouth daily. Mega Men     NEEDLE, DISP, 18 G (BD HYPODERMIC NEEDLE) 18G X 1" MISC USE AS DIRECTED TO ADMINISTER TESTOSTERONE.*NOT COVERED BY INSURANCE 100 each 0   rosuvastatin (CRESTOR) 40 MG tablet Take 1 tablet (40 mg total) by mouth daily. 90 tablet 3   sildenafil (VIAGRA) 100 MG tablet TAKE 0.5-1 TABLETS BY MOUTH DAILY AS NEEDED FOR ERECTILE DYSFUNCTION. 30 tablet 11   SYRINGE-NEEDLE, DISP, 3 ML (B-D 3CC LUER-LOK SYR 21GX1-1/2) 21G X 1-1/2" 3 ML MISC USE AS DIRECTED TO ADMINISTER TESTOSTERONE. 50 each 0   testosterone cypionate (DEPOTESTOSTERONE CYPIONATE) 200 MG/ML injection INJECT 0.5 MLS (100 MG TOTAL) INTO THE MUSCLE ONCE A WEEK. 4 mL 3   No current facility-administered medications for this visit.     Past Medical History:  Diagnosis Date   Hyperlipidemia    Hypertension    Median nerve injury    Melanoma of skin (HCC) 10/26/2018   Skin biopsy dermatology May 2020   OSA (obstructive sleep apnea) 09/11/2016   Uses CPAP every night    Ulnar nerve damage, right, initial encounter     Past Surgical History:  Procedure Laterality Date   BACK SURGERY     CARDIOVERSION N/A 12/29/2022  Procedure: CARDIOVERSION;  Surgeon: Chrystie Nose, MD;  Location: [facility];  Service: Cardiovascular;  Laterality: N/A;   CARPAL TUNNEL RELEASE     CARPAL TUNNEL WITH CUBITAL TUNNEL     HERNIA REPAIR     left  and right   INCISION AND DRAINAGE Left 09/22/2018   Procedure: INCISION AND DRAINAGE LEFT ELBOW SEROMA;  Surgeon: Dairl Ponder, MD;  Location: [facility];  Service: Orthopedics;  Laterality: Left;   TEE WITHOUT CARDIOVERSION N/A 12/29/2022   Procedure: TRANSESOPHAGEAL ECHOCARDIOGRAM;  Surgeon: Chrystie Nose, MD;  Location: [facility];  Service: Cardiovascular;   Laterality: N/A;   TOTAL HIP ARTHROPLASTY      Social History   Socioeconomic History   Marital status: Married    Spouse name: Not on file   Number of children: 0   Years of education: 16   Highest education level: Bachelor's degree (e.g., BA, AB, BS)  Occupational History   Occupation: Academic librarian coatings  Tobacco Use   Smoking status: Never   Smokeless tobacco: Never  Vaping Use   Vaping status: Never Used  Substance and Sexual Activity   Alcohol use: Yes    Comment: few beers on weekend   Drug use: No   Sexual activity: Not on file  Other Topics Concern   Not on file  Social History Narrative   No children   Drinks caffeine   Two story home   Right handed    Social Determinants of Health   Financial Resource Strain: Low Risk  (12/22/2022)   Overall Financial Resource Strain (CARDIA)    Difficulty of Paying Living Expenses: Not hard at all  Food Insecurity: No Food Insecurity (12/22/2022)   Hunger Vital Sign    Worried About Running Out of Food in the Last Year: Never true    Ran Out of Food in the Last Year: Never true  Transportation Needs: No Transportation Needs (12/22/2022)   PRAPARE - Administrator, Civil Service (Medical): No    Lack of Transportation (Non-Medical): No  Physical Activity: Sufficiently Active (12/22/2022)   Exercise Vital Sign    Days of Exercise per Week: 4 days    Minutes of Exercise per Session: 60 min  Stress: No Stress Concern Present (12/22/2022)   [name] - Occupational Stress Questionnaire    Feeling of Stress : Only a little  Social Connections: Socially Integrated (12/22/2022)   Social Connection and Isolation Panel [NHANES]    Frequency of Communication with Friends and Family: More than three times a week    Frequency of Social Gatherings with Friends and Family: More than three times a week    Attends Religious Services: 1 to 4 times per year    Active Member of [employer] or Organizations: Yes     Attends Engineer, structural: More than 4 times per year    Marital Status: Married  Catering manager Violence: Unknown (09/17/2021)   Received from [employer], [facility]   HITS    Physically Hurt: Not on file    Insult or Talk Down To: Not on file    Threaten Physical Harm: Not on file    Scream or Curse: Not on file    Family History  Problem Relation Age of Onset   Hypertension Mother    Heart attack Father    Heart disease Father    Hypertension Father     ROS: no fevers  or chills, productive cough, hemoptysis, dysphasia, odynophagia, melena, hematochezia, dysuria, hematuria, rash, seizure activity, orthopnea, PND, pedal edema, claudication. Remaining systems are negative.  Physical Exam: Well-developed well-nourished in no acute distress.  Skin is warm and dry.  HEENT is normal.  Neck is supple.  Chest is clear to auscultation with normal expansion.  Cardiovascular exam is regular rate and rhythm.  Abdominal exam nontender or distended. No masses palpated. Extremities show no edema. neuro grossly intact  ECG- personally reviewed  A/P  1 history of atrial flutter-status post cardioversion.  He remains in sinus rhythm.  Continue metoprolol and apixaban.  Plan is ultimately to proceed with atrial flutter ablation.  2 mitral regurgitation-this was noted to be moderate to severe on previous echocardiogram in the setting of atrial flutter.  By report it decreased to trivial once patient was in sinus rhythm.  Will plan to repeat echocardiogram to reassess LV function and mitral regurgitation now that sinus has been reestablished.  3 hypertension-blood pressure controlled.  Continue present medications.  4 coronary calcification-continue statin.  He is not having chest pain.  5 obstructive sleep apnea-continue CPAP.  Olga Millers, MD

## 2023-03-17 ENCOUNTER — Encounter: Payer: Self-pay | Admitting: Cardiology

## 2023-03-18 ENCOUNTER — Ambulatory Visit: Payer: 59 | Admitting: Cardiology

## 2023-03-18 LAB — CBC
Hematocrit: 56.7 % — ABNORMAL HIGH (ref 37.5–51.0)
Hemoglobin: 18.7 g/dL — ABNORMAL HIGH (ref 13.0–17.7)
MCH: 31.7 pg (ref 26.6–33.0)
MCHC: 33 g/dL (ref 31.5–35.7)
MCV: 96 fL (ref 79–97)
Platelets: 200 10*3/uL (ref 150–450)
RBC: 5.9 x10E6/uL — ABNORMAL HIGH (ref 4.14–5.80)
RDW: 13.2 % (ref 11.6–15.4)
WBC: 6.9 10*3/uL (ref 3.4–10.8)

## 2023-03-19 LAB — BASIC METABOLIC PANEL
BUN/Creatinine Ratio: 15 (ref 10–24)
BUN: 18 mg/dL (ref 8–27)
CO2: 24 mmol/L (ref 20–29)
Calcium: 9.4 mg/dL (ref 8.6–10.2)
Chloride: 101 mmol/L (ref 96–106)
Creatinine, Ser: 1.24 mg/dL (ref 0.76–1.27)
Glucose: 98 mg/dL (ref 70–99)
Potassium: 4.2 mmol/L (ref 3.5–5.2)
Sodium: 137 mmol/L (ref 134–144)
eGFR: 66 mL/min/{1.73_m2} (ref >59)

## 2023-04-01 NOTE — Pre-Procedure Instructions (Signed)
Instructed patient on the following items: Arrival time 1030 Nothing to eat or drink after midnight No meds AM of procedure Responsible person to drive you home and stay with you for 24 hrs  Have you missed any doses of anti-coagulant Eliquis- takes twice a day, hasn't missed any doses.  Don't take dose in the morning.

## 2023-04-02 ENCOUNTER — Ambulatory Visit (HOSPITAL_COMMUNITY)
Admission: RE | Disposition: A | Discharge status: Home / Self Care | Payer: Self-pay | Source: Home / Self Care | Attending: Cardiology

## 2023-04-02 ENCOUNTER — Other Ambulatory Visit: Payer: Self-pay

## 2023-04-02 ENCOUNTER — Ambulatory Visit (HOSPITAL_COMMUNITY)
Admission: RE | Admit: 2023-04-02 | Discharge: 2023-04-02 | Disposition: A | Discharge status: Home / Self Care | Payer: 59 | Attending: Cardiology | Admitting: Cardiology

## 2023-04-02 ENCOUNTER — Encounter (HOSPITAL_COMMUNITY): Payer: Self-pay | Admitting: Cardiology

## 2023-04-02 ENCOUNTER — Ambulatory Visit (HOSPITAL_COMMUNITY): Payer: 59 | Admitting: Anesthesiology

## 2023-04-02 ENCOUNTER — Ambulatory Visit (HOSPITAL_BASED_OUTPATIENT_CLINIC_OR_DEPARTMENT_OTHER): Payer: 59 | Admitting: Anesthesiology

## 2023-04-02 DIAGNOSIS — R0609 Other forms of dyspnea: Secondary | ICD-10-CM | POA: Insufficient documentation

## 2023-04-02 DIAGNOSIS — I483 Typical atrial flutter: Secondary | ICD-10-CM | POA: Diagnosis present

## 2023-04-02 DIAGNOSIS — I10 Essential (primary) hypertension: Secondary | ICD-10-CM | POA: Insufficient documentation

## 2023-04-02 DIAGNOSIS — I4892 Unspecified atrial flutter: Secondary | ICD-10-CM | POA: Diagnosis not present

## 2023-04-02 DIAGNOSIS — Z6832 Body mass index (BMI) 32.0-32.9, adult: Secondary | ICD-10-CM | POA: Diagnosis not present

## 2023-04-02 DIAGNOSIS — G4730 Sleep apnea, unspecified: Secondary | ICD-10-CM | POA: Diagnosis not present

## 2023-04-02 DIAGNOSIS — E669 Obesity, unspecified: Secondary | ICD-10-CM | POA: Diagnosis not present

## 2023-04-02 DIAGNOSIS — R5383 Other fatigue: Secondary | ICD-10-CM | POA: Insufficient documentation

## 2023-04-02 DIAGNOSIS — F419 Anxiety disorder, unspecified: Secondary | ICD-10-CM | POA: Diagnosis not present

## 2023-04-02 DIAGNOSIS — Z7901 Long term (current) use of anticoagulants: Secondary | ICD-10-CM | POA: Insufficient documentation

## 2023-04-02 HISTORY — PX: A-FLUTTER ABLATION: EP1230

## 2023-04-02 SURGERY — A-FLUTTER ABLATION
Anesthesia: General

## 2023-04-02 MED ORDER — FENTANYL CITRATE (PF) 250 MCG/5ML IJ SOLN
INTRAMUSCULAR | Status: AC
  Filled 2023-04-02: qty 5

## 2023-04-02 MED ORDER — SODIUM CHLORIDE 0.9% FLUSH: 3.0000 mL | INTRAVENOUS | Status: DC | PRN

## 2023-04-02 MED ORDER — PHENYLEPHRINE HCL-NACL 20-0.9 MG/250ML-% IV SOLN
INTRAVENOUS | Status: DC | PRN
  Administered 2023-04-02: 25 ug/min via INTRAVENOUS

## 2023-04-02 MED ORDER — PHENYLEPHRINE 80 MCG/ML (10ML) SYRINGE FOR IV PUSH (FOR BLOOD PRESSURE SUPPORT)
PREFILLED_SYRINGE | INTRAVENOUS | Status: DC | PRN
  Administered 2023-04-02 (×4): 160 ug via INTRAVENOUS

## 2023-04-02 MED ORDER — LIDOCAINE 2% (20 MG/ML) 5 ML SYRINGE
INTRAMUSCULAR | Status: DC | PRN
  Administered 2023-04-02: 60 mg via INTRAVENOUS

## 2023-04-02 MED ORDER — SUGAMMADEX SODIUM 200 MG/2ML IV SOLN
INTRAVENOUS | Status: DC | PRN
  Administered 2023-04-02: 200 mg via INTRAVENOUS

## 2023-04-02 MED ORDER — EPHEDRINE SULFATE-NACL 50-0.9 MG/10ML-% IV SOSY
PREFILLED_SYRINGE | INTRAVENOUS | Status: DC | PRN
  Administered 2023-04-02: 10 mg via INTRAVENOUS

## 2023-04-02 MED ORDER — MIDAZOLAM HCL 5 MG/5ML IJ SOLN
INTRAMUSCULAR | Status: AC
  Filled 2023-04-02: qty 5

## 2023-04-02 MED ORDER — HEPARIN (PORCINE) IN NACL 2000-0.9 UNIT/L-% IV SOLN
INTRAVENOUS | Status: DC | PRN
  Administered 2023-04-02: 1000 mL

## 2023-04-02 MED ORDER — ATROPINE SULFATE 1 MG/10ML IJ SOSY
PREFILLED_SYRINGE | INTRAMUSCULAR | Status: AC
  Filled 2023-04-02: qty 10

## 2023-04-02 MED ORDER — SODIUM CHLORIDE 0.9% FLUSH: 3.0000 mL | Freq: Two times a day (BID) | INTRAVENOUS | Status: DC

## 2023-04-02 MED ORDER — ONDANSETRON HCL 4 MG/2ML IJ SOLN
INTRAMUSCULAR | Status: DC | PRN
  Administered 2023-04-02: 4 mg via INTRAVENOUS

## 2023-04-02 MED ORDER — ONDANSETRON HCL 4 MG/2ML IJ SOLN: 4.0000 mg | Freq: Four times a day (QID) | INTRAMUSCULAR | Status: DC | PRN

## 2023-04-02 MED ORDER — SODIUM CHLORIDE 0.9% FLUSH: 10.0000 mL | Freq: Two times a day (BID) | INTRAVENOUS | Status: DC

## 2023-04-02 MED ORDER — ROCURONIUM BROMIDE 10 MG/ML (PF) SYRINGE
PREFILLED_SYRINGE | INTRAVENOUS | Status: DC | PRN
  Administered 2023-04-02: 60 mg via INTRAVENOUS

## 2023-04-02 MED ORDER — SODIUM CHLORIDE 0.9 % IV SOLN: INTRAVENOUS | Status: DC

## 2023-04-02 MED ORDER — ACETAMINOPHEN 325 MG PO TABS: 650.0000 mg | ORAL_TABLET | ORAL | Status: DC | PRN

## 2023-04-02 MED ORDER — FENTANYL CITRATE (PF) 250 MCG/5ML IJ SOLN
INTRAMUSCULAR | Status: DC | PRN
  Administered 2023-04-02: 100 ug via INTRAVENOUS

## 2023-04-02 MED ORDER — PROPOFOL 10 MG/ML IV BOLUS
INTRAVENOUS | Status: DC | PRN
Start: 2023-04-02 — End: 2023-04-02
  Administered 2023-04-02: 120 mg via INTRAVENOUS

## 2023-04-02 MED ORDER — MIDAZOLAM HCL 2 MG/2ML IJ SOLN
INTRAMUSCULAR | Status: DC | PRN
  Administered 2023-04-02: 2 mg via INTRAVENOUS

## 2023-04-02 SURGICAL SUPPLY — 11 items
BAG SNAP BAND KOVER 36X36 (MISCELLANEOUS) IMPLANT
CATH EZ STEER NAV 8MM F-J CUR (ABLATOR) IMPLANT
CATH WEB BI DIR CSDF CRV REPRO (CATHETERS) IMPLANT
CLOSURE MYNX CONTROL 6F/7F (Vascular Products) IMPLANT
PACK EP LATEX FREE (CUSTOM PROCEDURE TRAY) ×1
PACK EP LF (CUSTOM PROCEDURE TRAY) ×1 IMPLANT
PAD DEFIB RADIO PHYSIO CONN (PAD) ×1 IMPLANT
PATCH CARTO3 (PAD) IMPLANT
SHEATH PINNACLE 7F 10CM (SHEATH) IMPLANT
SHEATH PINNACLE 8F 10CM (SHEATH) IMPLANT
SHEATH PROBE COVER 6X72 (BAG) IMPLANT

## 2023-04-02 NOTE — H&P (Signed)
Electrophysiology Office Note:   Date:  04/02/2023  ID:  Glen Chambers, DOB Aug 08, 1960, MRN [id number]  Primary Cardiologist: None Electrophysiologist: Adlyn Fife Jorja Loa, MD      History of Present Illness:   Glen Chambers is a 62 y.o. male with h/o atrial flutter seen today for  for Electrophysiology evaluation of atrial flutter at the request of Olga Millers.      He was diagnosed with atrial flutter in May 2024.  He reports fatigue and dyspnea on exertion for approximately the last year.  His heart rate has been elevated as well.  There is no orthopnea, PND, edema.  He has no chest pain and no syncope.  He had a cardioversion 12/29/2022.  Immediately after the cardioversion, he felt much improved with less fatigue and shortness of breath.  He has gone back into atrial flutter with return of the symptoms.      Today, denies symptoms of palpitations, chest pain, shortness of breath, orthopnea, PND, lower extremity edema, claudication, dizziness, presyncope, syncope, bleeding, or neurologic sequela. The patient is tolerating medications without difficulties. Plan flutter ablatoin today.   EP Information / Studies Reviewed:    EKG is ordered today. Personal review as below.        Risk Assessment/Calculations:    CHA2DS2-VASc Score = 1   This indicates a 0.6% annual risk of stroke. The patient's score is based upon: CHF History: 0 HTN History: 1 Diabetes History: 0 Stroke History: 0 Vascular Disease History: 1 Age Score: 0 Gender Score: 0              Physical Exam:   VS:  BP 114/83   Pulse 100   Temp 98.1 F (36.7 C) (Temporal)   Resp 19   Ht 5\' 9"  (1.753 m)   Wt 99.8 kg   SpO2 99%   BMI 32.49 kg/m    Wt Readings from Last 3 Encounters:  04/02/23 99.8 kg  02/03/23 103 kg  12/29/22 99.8 kg    GEN: Well nourished, well developed, in no acute distress  HEENT: normal  Neck: no JVD, carotid bruits, or masses Cardiac: RRR; no murmurs, rubs, or gallops,no  edema  Respiratory:  clear to auscultation bilaterally, normal work of breathing GI: soft, nontender, nondistended, + BS MS: no deformity or atrophy  Skin: warm and dry,  Neuro:  Strength and sensation are intact Psych: euthymic mood, full affect   ASSESSMENT AND PLAN:    1.  Typical atrial flutter: Glen Chambers has presented today for surgery, with the diagnosis of atrial flutter.  The various methods of treatment have been discussed with the patient and family. After consideration of risks, benefits and other options for treatment, the patient has consented to  Procedure(s): Catheter ablation as a surgical intervention .  Risks include but not limited to complete heart block, stroke, esophageal damage, nerve damage, bleeding, vascular damage, tamponade, perforation, MI, and death. The patient's history has been reviewed, patient examined, no change in status, stable for surgery.  I have reviewed the patient's chart and labs.  Questions were answered to the patient's satisfaction.    Glen Worden Elberta Fortis, MD 04/02/2023 1:13 PM

## 2023-04-02 NOTE — Anesthesia Procedure Notes (Signed)
Procedure Name: Intubation Date/Time: 04/02/2023 2:42 PM  Performed by: Gus Puma, CRNAPre-anesthesia Checklist: Patient identified, Emergency Drugs available, Suction available and Patient being monitored Patient Re-evaluated:Patient Re-evaluated prior to induction Oxygen Delivery Method: Circle System Utilized Preoxygenation: Pre-oxygenation with 100% oxygen Induction Type: IV induction Ventilation: Mask ventilation without difficulty Laryngoscope Size: Mac and 4 Grade View: Grade II Tube type: Oral Number of attempts: 2 Airway Equipment and Method: Stylet and Oral airway Placement Confirmation: ETT inserted through vocal cords under direct vision, positive ETCO2 and breath sounds checked- equal and bilateral Secured at: 24 cm Tube secured with: Tape Dental Injury: Teeth and Oropharynx as per pre-operative assessment  Comments: Atraumatic intubation  1st Attempt by CRNA with MAC 4 blade -- Grade 2b view -- esophageal intubation  2nd Attempt by CRNA successful with MAC 4 blade -- additional anterior pressure obtained with a Grade II view  Able to mask ventilate between attempts -- vital signs stable throughout

## 2023-04-02 NOTE — Anesthesia Postprocedure Evaluation (Signed)
Anesthesia Post Note  Patient: Glen Chambers  Procedure(s) Performed: A-FLUTTER ABLATION     Patient location during evaluation: PACU Anesthesia Type: General Level of consciousness: awake and alert, patient cooperative and oriented Pain management: pain level controlled Vital Signs Assessment: post-procedure vital signs reviewed and stable Respiratory status: spontaneous breathing, nonlabored ventilation and respiratory function stable Cardiovascular status: blood pressure returned to baseline and stable Postop Assessment: no apparent nausea or vomiting Anesthetic complications: no   There were no known notable events for this encounter.  Last Vitals:  Vitals:   04/02/23 1730 04/02/23 1800  BP: 112/80 122/81  Pulse: 70 74  Resp: 17 18  Temp:    SpO2: 99% 99%    Last Pain:  Vitals:   04/02/23 1635  TempSrc: Oral  PainSc: 0-No pain                 Massie Cogliano,E. Sharline Lehane

## 2023-04-02 NOTE — Transfer of Care (Signed)
Immediate Anesthesia Transfer of Care Note  Patient: Glen Chambers  Procedure(s) Performed: A-FLUTTER ABLATION  Patient Location: Cath Lab  Anesthesia Type:General  Level of Consciousness: awake, drowsy, and patient cooperative  Airway & Oxygen Therapy: Patient Spontanous Breathing  Post-op Assessment: Report given to RN and Post -op Vital signs reviewed and stable  Post vital signs: Reviewed and stable  Last Vitals:  Vitals Value Taken Time  BP    Temp    Pulse    Resp    SpO2      Last Pain:  Vitals:   04/02/23 1112  TempSrc:   PainSc: 0-No pain         Complications: There were no known notable events for this encounter.

## 2023-04-02 NOTE — Anesthesia Preprocedure Evaluation (Addendum)
Anesthesia Evaluation  Patient identified by MRN, date of birth, ID band Patient awake    Reviewed: Allergy & Precautions, NPO status , Patient's Chart, lab work & pertinent test results, reviewed documented beta blocker date and time   History of Anesthesia Complications Negative for: history of anesthetic complications  Airway Mallampati: I  TM Distance: >3 FB Neck ROM: Full    Dental  (+) Dental Advisory Given   Pulmonary sleep apnea and Continuous Positive Airway Pressure Ventilation    breath sounds clear to auscultation       Cardiovascular hypertension, Pt. on medications and Pt. on home beta blockers (-) angina + dysrhythmias Atrial Fibrillation  Rhythm:Irregular Rate:Normal  12/2022 ECHO: EF 45 to 50%.  1. The LV has mildly decreased function, global hypokinesis. There is mild LVH.  2. RVF is normal. The right ventricular size is normal.   3. Left atrial size was moderately dilated. No left atrial/left atrial appendage thrombus was detected.  4. Right atrial size was mildly dilated.  5. The mitral valve is abnormal. Severe MR.  6. There is calcification and fusion of the base of the left and right coronary cusps, no apparent stenosis of the valve was appreciated. The aortic valve is tricuspid. There is mild calcification of the aortic valve. Aortic valve regurgitation is not visualized. Aortic valve sclerosis/calcification is present, without any evidence of aortic stenosis.      Neuro/Psych  PSYCHIATRIC DISORDERS Anxiety     negative neurological ROS     GI/Hepatic negative GI ROS, Neg liver ROS,,,  Endo/Other  Obesity BMI 34  Renal/GU negative Renal ROS  negative genitourinary   Musculoskeletal  (+) Arthritis , Osteoarthritis,    Abdominal   Peds  Hematology negative hematology ROS (+) eliquis   Anesthesia Other Findings   Reproductive/Obstetrics negative OB ROS                              Anesthesia Physical Anesthesia Plan  ASA: 3  Anesthesia Plan: General   Post-op Pain Management: Tylenol PO (pre-op)*   Induction: Intravenous  PONV Risk Score and Plan: 2 and Ondansetron, Dexamethasone, Midazolam and Treatment may vary due to age or medical condition  Airway Management Planned: Oral ETT  Additional Equipment: None  Intra-op Plan:   Post-operative Plan: Extubation in OR  Informed Consent: I have reviewed the patients History and Physical, chart, labs and discussed the procedure including the risks, benefits and alternatives for the proposed anesthesia with the patient or authorized representative who has indicated his/her understanding and acceptance.     Dental advisory given  Plan Discussed with: CRNA and Surgeon  Anesthesia Plan Comments:        Anesthesia Quick Evaluation

## 2023-04-02 NOTE — Discharge Instructions (Addendum)

## 2023-04-03 MED FILL — Midazolam HCl Inj 5 MG/5ML (Base Equivalent): INTRAMUSCULAR | Qty: 2 | Status: AC

## 2023-04-06 NOTE — Plan of Care (Signed)
[facility] Tonsillectomy/Adenoidectomy, Postoperative PEDS care plan entered in error.

## 2023-04-07 ENCOUNTER — Encounter: Payer: Self-pay | Admitting: *Deleted

## 2023-04-17 ENCOUNTER — Telehealth: Payer: Self-pay | Admitting: *Deleted

## 2023-04-17 DIAGNOSIS — E782 Mixed hyperlipidemia: Secondary | ICD-10-CM

## 2023-04-17 LAB — HEPATIC FUNCTION PANEL
ALT: 48 [IU]/L — ABNORMAL HIGH (ref 0–44)
AST: 41 [IU]/L — ABNORMAL HIGH (ref 0–40)
Albumin: 4.5 g/dL (ref 3.9–4.9)
Alkaline Phosphatase: 75 [IU]/L (ref 44–121)
Bilirubin Total: 0.7 mg/dL (ref 0.0–1.2)
Bilirubin, Direct: 0.21 mg/dL (ref 0.00–0.40)
Total Protein: 6.5 g/dL (ref 6.0–8.5)

## 2023-04-17 LAB — LIPID PANEL
Chol/HDL Ratio: 3.6 {ratio} (ref 0.0–5.0)
Cholesterol, Total: 134 mg/dL (ref 100–199)
HDL: 37 mg/dL — ABNORMAL LOW (ref >39)
LDL Chol Calc (NIH): 78 mg/dL (ref 0–99)
Triglycerides: 104 mg/dL (ref 0–149)
VLDL Cholesterol Cal: 19 mg/dL (ref 5–40)

## 2023-04-17 MED ORDER — EZETIMIBE 10 MG PO TABS
10.0000 mg | ORAL_TABLET | Freq: Every day | ORAL | 3 refills | Status: DC
Start: 2023-04-17 — End: 2024-03-29

## 2023-04-17 NOTE — Telephone Encounter (Signed)
Pt has reviewed results via my chart  New script sent to the pharmacy  Lab orders mailed to the pt  

## 2023-04-17 NOTE — Telephone Encounter (Signed)
-----   Message from Olga Millers sent at 04/17/2023  6:53 AM EDT ----- LDL not at goal; add zetia 10 mg daily; L/L 8 weeks Olga Millers

## 2023-04-29 NOTE — Progress Notes (Unsigned)
Electrophysiology Office Note:   Date:  04/30/2023  ID:  Glen Chambers, DOB 18-Apr-1961, MRN [id number]  Primary Cardiologist: None Electrophysiologist: Will Jorja Loa, MD      History of Present Illness:   Glen Chambers is a 62 y.o. male with h/o atrial flutter (dx 10/2022), AF (presented in AF at time of ablation in 03/2023 s/p DCCV) seen today for routine electrophysiology followup.   He was seen in clinic by Dr. Elberta Fortis in 01/2023 for evaluation of AFL. The patient had symptoms of dyspnea, fatigue and fast HR's for some time.  He underwent DCCV in 12/2022 but failed and was in AFL at time of EP visit in 01/2023. He underwent CTI ablation 04/02/23 by Dr. Elberta Fortis. Upon presentation for AFL ablation, he was in AFib and was cardioverted.   Since last being seen in our clinic the patient reports he feels so much better. He monitors his HR and is largely in the mid 60's to 70's.  After 20-25 minutes of cardio at the gym, his HR's are in the low 100's. No further symptoms of AFL.  He has known OSA and is compliant with his CPAP.     He denies chest pain, palpitations, dyspnea, PND, orthopnea, nausea, vomiting, dizziness, syncope, edema, weight gain, or early satiety.   Review of systems complete and found to be negative unless listed in HPI.   EP Information / Studies Reviewed:    EKG is ordered today. Personal review as below.  EKG Interpretation Date/Time:  Thursday April 30 2023 09:31:45 EST Ventricular Rate:  66 PR Interval:  184 QRS Duration:  112 QT Interval:  386 QTC Calculation: 404 R Axis:   -71  Text Interpretation: Normal sinus rhythm Left axis deviation Pulmonary disease pattern Incomplete right bundle branch block Confirmed by Canary Brim ([id number]) on 04/30/2023 9:46:46 AM   Studies:  ECHO 11/2022 >  LVEF 60-65%, no RWMA, LA moderately dilated, MV is degenerative, mod-severe central mitral valve regurgitation, AV sclerosis / calcification without evidence of  stenosis   Arrhythmia / AAD AFL > dx 10/2022. Symptoms of fatigue and dyspnea.  DCCV 12/29/22 > back in AFL by 02/03/23 office visit   CTI Ablation 04/02/23, presented in AFIB s/p DCCV 150j  Risk Assessment/Calculations:    CHA2DS2-VASc Score = 1   This indicates a 0.6% annual risk of stroke. The patient's score is based upon: CHF History: 0 HTN History: 1 Diabetes History: 0 Stroke History: 0 Vascular Disease History: 1 Age Score: 0 Gender Score: 0              Physical Exam:   VS:  BP 124/82   Pulse 66   Ht 5\' 9"  (1.753 m)   Wt 232 lb (105.2 kg)   SpO2 97%   BMI 34.26 kg/m    Wt Readings from Last 3 Encounters:  04/30/23 232 lb (105.2 kg)  04/02/23 220 lb (99.8 kg)  02/03/23 227 lb (103 kg)     GEN: Well nourished, well developed in no acute distress NECK: No JVD; No carotid bruits CARDIAC: Regular rate and rhythm, no murmurs, rubs, gallops RESPIRATORY:  Clear to auscultation without rales, wheezing or rhonchi  ABDOMEN: Soft, non-tender, non-distended EXTREMITIES:  No edema; No deformity   ASSESSMENT AND PLAN:    Typical Atrial Flutter  Presented in AFib for Ablation s/p DCCV  CHA2DS2-VASc 1. S/p AFL ablation 03/1723 -continue lopressor 25mg  BID  -no known burden of symptoms -monitors for sx with watch, and was symptomatic with prior  arrhythmia   Secondary Hypercoagulable State  -continue eliquis 5mg  BID, appropriate dosing by age/wt  -monitor for further evidence of AF, given low CHA2DS2-VASc could revisit stopping OAC in future if no further evidence of AF  OSA  -on CPAP, compliance encouraged   Mitral Valve Regurgitation  -per primary Cardiology  Follow up with Dr. Elberta Fortis in 6 months  Signed, Canary Brim, MSN, APRN, NP-C, AGACNP-BC [facility] - Electrophysiology  04/30/2023, 12:52 PM

## 2023-04-30 ENCOUNTER — Encounter: Payer: Self-pay | Admitting: Pulmonary Disease

## 2023-04-30 ENCOUNTER — Ambulatory Visit: Payer: 59 | Attending: Pulmonary Disease | Admitting: Pulmonary Disease

## 2023-04-30 VITALS — BP 124/82 | HR 66 | Ht 69.0 in | Wt 232.0 lb

## 2023-04-30 DIAGNOSIS — I483 Typical atrial flutter: Secondary | ICD-10-CM

## 2023-04-30 DIAGNOSIS — D6869 Other thrombophilia: Secondary | ICD-10-CM | POA: Diagnosis not present

## 2023-04-30 DIAGNOSIS — G4733 Obstructive sleep apnea (adult) (pediatric): Secondary | ICD-10-CM

## 2023-04-30 NOTE — Patient Instructions (Signed)
Medication Instructions:  Your physician recommends that you continue on your current medications as directed. Please refer to the Current Medication list given to you today.  *If you need a refill on your cardiac medications before your next appointment, please call your pharmacy*  Lab Work: None ordered If you have labs (blood work) drawn today and your tests are completely normal, you will receive your results only by: MyChart Message (if you have MyChart) OR A paper copy in the mail If you have any lab test that is abnormal or we need to change your treatment, we will call you to review the results.  Follow-Up: At [facility], you and your health needs are our priority.  As part of our continuing mission to provide you with exceptional heart care, we have created designated Provider Care Teams.  These Care Teams include your primary Cardiologist (physician) and Advanced Practice Providers (APPs -  Physician Assistants and Nurse Practitioners) who all work together to provide you with the care you need, when you need it.  Your next appointment:   6 month(s)  Provider:   Loman Brooklyn, MD or Canary Brim, NP

## 2023-05-06 ENCOUNTER — Other Ambulatory Visit: Payer: Self-pay | Admitting: Family Medicine

## 2023-05-07 NOTE — Telephone Encounter (Signed)
Called spoke with patient he is scheduled for January 14th for his followup

## 2023-05-07 NOTE — Telephone Encounter (Signed)
Pls contact the pt to schedule 45-month follow-up with Dr. Ashley Royalty. Thanks

## 2023-05-08 ENCOUNTER — Other Ambulatory Visit: Payer: Self-pay | Admitting: Family Medicine

## 2023-05-24 ENCOUNTER — Other Ambulatory Visit: Payer: Self-pay | Admitting: Family Medicine

## 2023-06-01 ENCOUNTER — Other Ambulatory Visit: Payer: Self-pay | Admitting: Family Medicine

## 2023-06-01 DIAGNOSIS — I10 Essential (primary) hypertension: Secondary | ICD-10-CM

## 2023-06-30 ENCOUNTER — Ambulatory Visit (INDEPENDENT_AMBULATORY_CARE_PROVIDER_SITE_OTHER): Payer: 59 | Admitting: Family Medicine

## 2023-06-30 ENCOUNTER — Ambulatory Visit (INDEPENDENT_AMBULATORY_CARE_PROVIDER_SITE_OTHER): Payer: 59 | Admitting: Sports Medicine

## 2023-06-30 ENCOUNTER — Encounter: Payer: Self-pay | Admitting: Family Medicine

## 2023-06-30 ENCOUNTER — Other Ambulatory Visit (INDEPENDENT_AMBULATORY_CARE_PROVIDER_SITE_OTHER): Payer: 59

## 2023-06-30 VITALS — BP 134/77 | HR 64 | Ht 69.0 in | Wt 233.0 lb

## 2023-06-30 DIAGNOSIS — R7989 Other specified abnormal findings of blood chemistry: Secondary | ICD-10-CM | POA: Diagnosis not present

## 2023-06-30 DIAGNOSIS — I483 Typical atrial flutter: Secondary | ICD-10-CM | POA: Diagnosis not present

## 2023-06-30 DIAGNOSIS — F419 Anxiety disorder, unspecified: Secondary | ICD-10-CM

## 2023-06-30 DIAGNOSIS — M1711 Unilateral primary osteoarthritis, right knee: Secondary | ICD-10-CM

## 2023-06-30 DIAGNOSIS — R6882 Decreased libido: Secondary | ICD-10-CM | POA: Diagnosis not present

## 2023-06-30 DIAGNOSIS — I10 Essential (primary) hypertension: Secondary | ICD-10-CM | POA: Diagnosis not present

## 2023-06-30 DIAGNOSIS — E782 Mixed hyperlipidemia: Secondary | ICD-10-CM

## 2023-06-30 MED ORDER — TRIAMCINOLONE ACETONIDE 40 MG/ML IJ SUSP
40.0000 mg | Freq: Once | INTRAMUSCULAR | Status: AC
  Administered 2023-06-30: 40 mg via INTRAMUSCULAR

## 2023-06-30 NOTE — Assessment & Plan Note (Signed)
 He has done well since having ablation. He has had a couple of episodes of A. Fib since this but remains on metoprolol and eliquis.  He will continue to see cardiology/EP as well.

## 2023-06-30 NOTE — Progress Notes (Signed)
 Glen Chambers - 63 y.o. male MRN [id number]  Date of birth: Feb 10, 1961  Subjective Chief Complaint  Patient presents with   hypertension, low testosterone   6 mth follow up    6 mth follow up     HPI Glen Chambers is  a 63 y.o. male here today for follow up visit.    He had procedure for A. Flutter ablation in October.  He reports that he is doing well.  He has had a couple of episodes of A. Fib based on his watch readings.  He remains on metoprolol .  Additionally, he is on eliquis  for anticoagulation.  His BP remains well controlled with amlodipine  and candesartan /hydrochlorothiazide.  He denies palpitations, dysprnea, chest pain, or headache.  He remains on testosterone  replacement.  Feels good at current strength.  Additionally he is on arimidex  for elevated estradiol  levels.    Tolerating crestor  well at current strnegth for management of lipids.   ROS:  A comprehensive ROS was completed and negative except as noted per HPI    Allergies  Allergen Reactions   Lisinopril  Cough    Mild cough with lisinopril     Past Medical History:  Diagnosis Date   Hyperlipidemia    Hypertension    Median nerve injury    Melanoma of skin (HCC) 10/26/2018   Skin biopsy dermatology May 2020   OSA (obstructive sleep apnea) 09/11/2016   Uses CPAP every night    Ulnar nerve damage, right, initial encounter     Past Surgical History:  Procedure Laterality Date   A-FLUTTER ABLATION N/A 04/02/2023   Procedure: A-FLUTTER ABLATION;  Surgeon: Inocencio Soyla Lunger, MD;  Location: [facility] INVASIVE CV [facility];  Service: Cardiovascular;  Laterality: N/A;   BACK SURGERY     CARDIOVERSION N/A 12/29/2022   Procedure: CARDIOVERSION;  Surgeon: Mona Vinie BROCKS, MD;  Location: [facility] INVASIVE CV [facility];  Service: Cardiovascular;  Laterality: N/A;   CARPAL TUNNEL RELEASE     CARPAL TUNNEL WITH CUBITAL TUNNEL     HERNIA REPAIR     left  and right   INCISION AND DRAINAGE Left 09/22/2018   Procedure: INCISION AND DRAINAGE  LEFT ELBOW SEROMA;  Surgeon: Sissy Cough, MD;  Location: [facility];  Service: Orthopedics;  Laterality: Left;   TEE WITHOUT CARDIOVERSION N/A 12/29/2022   Procedure: TRANSESOPHAGEAL ECHOCARDIOGRAM;  Surgeon: Mona Vinie BROCKS, MD;  Location: [facility] LAB;  Service: Cardiovascular;  Laterality: N/A;   TOTAL HIP ARTHROPLASTY      Social History   Socioeconomic History   Marital status: Married    Spouse name: Not on file   Number of children: 0   Years of education: 16   Highest education level: Associate degree: occupational, scientist, product/process development, or vocational program  Occupational History   Occupation: [employer]  Tobacco Use   Smoking status: Never   Smokeless tobacco: Never  Vaping Use   Vaping status: Never Used  Substance and Sexual Activity   Alcohol use: Yes    Comment: few beers on weekend   Drug use: No   Sexual activity: Not on file  Other Topics Concern   Not on file  Social History Narrative   No children   Drinks caffeine   Two story home   Right handed    Social Drivers of Health   Financial Resource Strain: Low Risk  (06/29/2023)   Overall Financial Resource Strain (CARDIA)    Difficulty of Paying Living Expenses: Not hard at all  Food Insecurity:  No Food Insecurity (06/29/2023)   Hunger Vital Sign    Worried About Running Out of Food in the Last Year: Never true    Ran Out of Food in the Last Year: Never true  Transportation Needs: No Transportation Needs (06/29/2023)   PRAPARE - Administrator, Civil Service (Medical): No    Lack of Transportation (Non-Medical): No  Physical Activity: Sufficiently Active (06/29/2023)   Exercise Vital Sign    Days of Exercise per Week: 5 days    Minutes of Exercise per Session: 60 min  Stress: No Stress Concern Present (06/29/2023)   [name] - Occupational Stress Questionnaire    Feeling of Stress : Not at all  Social Connections: Moderately Integrated  (06/29/2023)   Social Connection and Isolation Panel [NHANES]    Frequency of Communication with Friends and Family: More than three times a week    Frequency of Social Gatherings with Friends and Family: Twice a week    Attends Religious Services: Never    Database Administrator or Organizations: Yes    Attends Engineer, Structural: More than 4 times per year    Marital Status: Married    Family History  Problem Relation Age of Onset   Hypertension Mother    Heart attack Father    Heart disease Father    Hypertension Father     Health Maintenance  Topic Date Due   Fecal DNA (Cologuard)  Never done   COVID-19 Vaccine (3 - Pfizer risk series) 07/07/2023 (Originally 10/31/2019)   INFLUENZA VACCINE  09/14/2023 (Originally 01/15/2023)   Zoster Vaccines- Shingrix (1 of 2) 09/19/2023 (Originally 10/17/1979)   Pneumococcal Vaccine 60-79 Years old (1 of 2 - PCV) 06/29/2024 (Originally 10/17/1966)   DTaP/Tdap/Td (2 - Td or Tdap) 10/22/2023   Hepatitis C Screening  Completed   HIV Screening  Completed   HPV VACCINES  Aged Out     ----------------------------------------------------------------------------------------------------------------------------------------------------------------------------------------------------------------- Physical Exam BP 134/77 (BP Location: Left Leg, Patient Position: Sitting, Cuff Size: Large)   Pulse 64   Ht 5' 9 (1.753 m)   Wt 233 lb (105.7 kg)   SpO2 100%   BMI 34.41 kg/m   Physical Exam Constitutional:      Appearance: Normal appearance.  Eyes:     General: No scleral icterus. Cardiovascular:     Rate and Rhythm: Normal rate and regular rhythm.  Pulmonary:     Effort: Pulmonary effort is normal.     Breath sounds: Normal breath sounds.  Neurological:     General: No focal deficit present.     Mental Status: He is alert.  Psychiatric:        Mood and Affect: Mood normal.        Behavior: Behavior normal.      ------------------------------------------------------------------------------------------------------------------------------------------------------------------------------------------------------------------- Assessment and Plan  Typical atrial flutter (HCC) He has done well since having ablation. He has had a couple of episodes of A. Fib since this but remains on metoprolol  and eliquis .  He will continue to see cardiology/EP as well.   Low testosterone  in male Doing well with TRT.  Update testosterone , estradiol  and PSA levels.   Essential (primary) hypertension BP remains well controlled today.  Recommend continuation of current medications.  F/u in 6 months.   Anxiety Continue lexapro  at current strength.   HLD (hyperlipidemia) Continue crestor  40mg  daily.    No orders of the defined types were placed in this encounter.   Return in about 6 months (  around 12/28/2023) for Hypertension/Low testosterone .    This visit occurred during the SARS-CoV-2 public health emergency.  Safety protocols were in place, including screening questions prior to the visit, additional usage of staff PPE, and extensive cleaning of exam room while observing appropriate contact time as indicated for disinfecting solutions.

## 2023-06-30 NOTE — Assessment & Plan Note (Addendum)
 Very pleasant 63 year old male, known knee osteoarthritis, he had Euflexxa back in 2023, did not respond, occasional effusions. He does have known arthritis and meniscal tearing on imaging. We did an aspiration and injection back in July, repeat today. Return to see me as needed. We can certainly get him approved for [facility]co again if he desires.

## 2023-06-30 NOTE — Code Documentation (Signed)
 done

## 2023-06-30 NOTE — Addendum Note (Signed)
 Addended by: Carren Rang A on: 06/30/2023 04:13 PM   Modules accepted: Orders

## 2023-06-30 NOTE — Progress Notes (Signed)
    Procedures performed today:    Procedure: Real-time Ultrasound Guided aspiration and injection of right knee Device: Samsung HS60  Verbal informed consent obtained.  Time-out conducted.  Noted no overlying erythema, induration, or other signs of local infection.  Skin prepped in a sterile fashion.  Local anesthesia: Topical Ethyl chloride.  With sterile technique and under real time ultrasound guidance: Using an 18-gauge needle advanced to the suprapatellar recess and aspirated 21 mL of clear, straw-colored fluid, 1 cc kenalog  40, 2 cc lidocaine , 2 cc bupivacaine  injected easily. Completed without difficulty  Advised to call if fevers/chills, erythema, induration, drainage, or persistent bleeding.  Images permanently stored and available for review in PACS.  Impression: Technically successful ultrasound guided therapeutic arthrocentesis and injection.  Independent interpretation of notes and tests performed by another provider:   None.  Brief History, Exam, Impression, and Recommendations:    Primary osteoarthritis of right knee Very pleasant 63 year old male, known knee osteoarthritis, he had Euflexxa back in 2023, did not respond, occasional effusions. He does have known arthritis and meniscal tearing on imaging. We did an aspiration and injection back in July, repeat today. Return to see me as needed. We can certainly get him approved for Visco again if he desires.    ____________________________________________ Debby PARAS. Curtis, M.D., ABFM., CAQSM., AME. Primary Care and Sports Medicine [facility]  Adjunct Professor of [facility]  [employer]  Restaurant Manager, [employer]

## 2023-06-30 NOTE — Assessment & Plan Note (Signed)
Continue crestor 40mg daily. 

## 2023-06-30 NOTE — Assessment & Plan Note (Signed)
BP remains well controlled today.  Recommend continuation of current medications.  F/u in 6 months.

## 2023-06-30 NOTE — Assessment & Plan Note (Signed)
 Doing well with TRT.  Update testosterone, estradiol and PSA levels.

## 2023-06-30 NOTE — Assessment & Plan Note (Addendum)
 Continue lexapro at current strength.

## 2023-07-02 LAB — TESTOSTERONE: Testosterone: 443 ng/dL (ref 264–916)

## 2023-07-02 LAB — ESTRADIOL

## 2023-07-02 LAB — PSA: Prostate Specific Ag, Serum: 2.5 ng/mL (ref 0.0–4.0)

## 2023-07-10 ENCOUNTER — Encounter: Payer: Self-pay | Admitting: Family Medicine

## 2023-07-13 ENCOUNTER — Other Ambulatory Visit: Payer: Self-pay | Admitting: Family Medicine

## 2023-07-14 MED ORDER — ESCITALOPRAM OXALATE 10 MG PO TABS: 10.0000 mg | ORAL_TABLET | Freq: Every day | ORAL | 1 refills | Status: DC

## 2023-07-21 ENCOUNTER — Encounter (INDEPENDENT_AMBULATORY_CARE_PROVIDER_SITE_OTHER): Payer: 59 | Admitting: Family Medicine

## 2023-07-21 DIAGNOSIS — J111 Influenza due to unidentified influenza virus with other respiratory manifestations: Secondary | ICD-10-CM | POA: Diagnosis not present

## 2023-07-21 MED ORDER — OSELTAMIVIR PHOSPHATE 75 MG PO CAPS: 75.0000 mg | ORAL_CAPSULE | Freq: Two times a day (BID) | ORAL | 0 refills | Status: AC

## 2023-07-21 NOTE — Telephone Encounter (Signed)
 Please see the MyChart message reply(ies) for my assessment and plan.    This patient gave consent for this Medical Advice Message and is aware that it may result in a bill to [employer], as well as the possibility of receiving a bill for a co-payment or deductible. They are an established patient, but are not seeking medical advice exclusively about a problem treated during an in person or video visit in the last seven days. I did not recommend an in person or video visit within seven days of my reply.    I spent a total of 6 minutes cumulative time within 7 days through [employer].  Everrett Coombe, DO

## 2023-07-28 ENCOUNTER — Encounter: Payer: Self-pay | Admitting: *Deleted

## 2023-08-31 ENCOUNTER — Other Ambulatory Visit: Payer: Self-pay | Admitting: Family Medicine

## 2023-09-01 ENCOUNTER — Encounter: Payer: Self-pay | Admitting: Family Medicine

## 2023-09-02 ENCOUNTER — Other Ambulatory Visit: Payer: Self-pay | Admitting: Family Medicine

## 2023-09-02 ENCOUNTER — Other Ambulatory Visit: Payer: Self-pay

## 2023-09-02 ENCOUNTER — Other Ambulatory Visit: Payer: Self-pay | Admitting: Cardiology

## 2023-09-02 ENCOUNTER — Telehealth: Payer: Self-pay

## 2023-09-02 DIAGNOSIS — R7989 Other specified abnormal findings of blood chemistry: Secondary | ICD-10-CM

## 2023-09-02 DIAGNOSIS — R6882 Decreased libido: Secondary | ICD-10-CM

## 2023-09-02 DIAGNOSIS — I483 Typical atrial flutter: Secondary | ICD-10-CM

## 2023-09-02 MED ORDER — TESTOSTERONE CYPIONATE 200 MG/ML IM SOLN: INTRAMUSCULAR | 3 refills | Status: DC

## 2023-09-02 NOTE — Telephone Encounter (Signed)
 Testosterone shows print as how was sent.  Is this correct or does it need to be resent vis e-script for patient?

## 2023-09-02 NOTE — Telephone Encounter (Signed)
 Requesting rx rf of testosterone 200mg /ml Last written 05/08/2023 Last OV 06/30/2023 Upcoming appt none

## 2023-09-02 NOTE — Telephone Encounter (Signed)
 Copied from CRM [id number]. Topic: Clinical - Prescription Issue >> Sep 02, 2023  1:13 PM Nila Nephew wrote: Reason for CRM: Patient is calling to state that [facility] is denying the prescription for Testosterone for this patient because they say his PCP is requiring a written prescription. Patient requesting we contact pharmacy to clarify, as he is two days behind on his next dose.

## 2023-10-31 ENCOUNTER — Other Ambulatory Visit: Payer: Self-pay | Admitting: Family Medicine

## 2023-11-29 ENCOUNTER — Other Ambulatory Visit: Payer: Self-pay | Admitting: Family Medicine

## 2023-11-30 ENCOUNTER — Other Ambulatory Visit: Payer: Self-pay | Admitting: Family Medicine

## 2023-11-30 MED ORDER — ANASTROZOLE 1 MG PO TABS: ORAL_TABLET | ORAL | 1 refills | Status: DC

## 2023-12-28 ENCOUNTER — Ambulatory Visit: Payer: 59 | Admitting: Family Medicine

## 2023-12-29 ENCOUNTER — Other Ambulatory Visit: Payer: Self-pay | Admitting: Family Medicine

## 2024-01-02 ENCOUNTER — Other Ambulatory Visit: Payer: Self-pay | Admitting: Cardiology

## 2024-01-02 DIAGNOSIS — I483 Typical atrial flutter: Secondary | ICD-10-CM

## 2024-01-03 ENCOUNTER — Other Ambulatory Visit: Payer: Self-pay | Admitting: Family Medicine

## 2024-01-04 NOTE — Telephone Encounter (Signed)
 Eliquis  5mg  refill request received. Patient is 63 years old, weight-105.7kg, Crea-1.24 on 03/18/23, Diagnosis-Aflutter/Afib, and last seen by Daphne Barrack on 04/30/23. Dose is appropriate based on dosing criteria. Will send in refill to requested pharmacy.

## 2024-01-05 ENCOUNTER — Other Ambulatory Visit: Payer: Self-pay | Admitting: Family Medicine

## 2024-01-05 DIAGNOSIS — I10 Essential (primary) hypertension: Secondary | ICD-10-CM

## 2024-01-09 ENCOUNTER — Other Ambulatory Visit: Payer: Self-pay | Admitting: Family Medicine

## 2024-01-09 DIAGNOSIS — I10 Essential (primary) hypertension: Secondary | ICD-10-CM

## 2024-01-12 ENCOUNTER — Encounter: Payer: Self-pay | Admitting: Family Medicine

## 2024-01-12 DIAGNOSIS — I10 Essential (primary) hypertension: Secondary | ICD-10-CM

## 2024-01-12 MED ORDER — AMLODIPINE BESYLATE 10 MG PO TABS: 10.0000 mg | ORAL_TABLET | Freq: Every day | ORAL | 0 refills | Status: DC

## 2024-01-19 ENCOUNTER — Other Ambulatory Visit: Payer: Self-pay | Admitting: Cardiology

## 2024-01-19 DIAGNOSIS — E782 Mixed hyperlipidemia: Secondary | ICD-10-CM

## 2024-01-26 ENCOUNTER — Encounter: Payer: Self-pay | Admitting: Family Medicine

## 2024-01-26 ENCOUNTER — Ambulatory Visit (INDEPENDENT_AMBULATORY_CARE_PROVIDER_SITE_OTHER): Admitting: Family Medicine

## 2024-01-26 VITALS — BP 136/74 | HR 65 | Resp 20 | Ht 69.0 in | Wt 235.0 lb

## 2024-01-26 DIAGNOSIS — R7989 Other specified abnormal findings of blood chemistry: Secondary | ICD-10-CM | POA: Diagnosis not present

## 2024-01-26 DIAGNOSIS — I10 Essential (primary) hypertension: Secondary | ICD-10-CM

## 2024-01-26 DIAGNOSIS — I483 Typical atrial flutter: Secondary | ICD-10-CM | POA: Diagnosis not present

## 2024-01-26 DIAGNOSIS — Z23 Encounter for immunization: Secondary | ICD-10-CM | POA: Diagnosis not present

## 2024-01-26 DIAGNOSIS — E782 Mixed hyperlipidemia: Secondary | ICD-10-CM

## 2024-01-26 DIAGNOSIS — F419 Anxiety disorder, unspecified: Secondary | ICD-10-CM | POA: Diagnosis not present

## 2024-01-26 DIAGNOSIS — D582 Other hemoglobinopathies: Secondary | ICD-10-CM

## 2024-01-26 MED ORDER — CANDESARTAN CILEXETIL-HCTZ 32-25 MG PO TABS: 1.0000 | ORAL_TABLET | Freq: Every day | ORAL | 2 refills | Status: AC

## 2024-01-26 MED ORDER — ANASTROZOLE 1 MG PO TABS: ORAL_TABLET | ORAL | 3 refills | Status: AC

## 2024-01-26 MED ORDER — AMLODIPINE BESYLATE 10 MG PO TABS: 10.0000 mg | ORAL_TABLET | Freq: Every day | ORAL | 3 refills | Status: AC

## 2024-01-26 MED ORDER — ESCITALOPRAM OXALATE 10 MG PO TABS: 10.0000 mg | ORAL_TABLET | Freq: Every day | ORAL | 2 refills | Status: AC

## 2024-01-26 NOTE — Assessment & Plan Note (Signed)
 Doing well with TRT.  Update testosterone, estradiol and PSA levels.

## 2024-01-26 NOTE — Assessment & Plan Note (Signed)
BP remains well controlled today.  Recommend continuation of current medications.  F/u in 6 months.

## 2024-01-26 NOTE — Assessment & Plan Note (Signed)
 S/p Ablation and is doing well. He will continue management per cardiology.

## 2024-01-26 NOTE — Assessment & Plan Note (Signed)
 Continue lexapro at current strength.

## 2024-01-26 NOTE — Progress Notes (Signed)
 Glen Chambers - 63 y.o. male MRN [id number]  Date of birth: April 16, 1961  Subjective Chief Complaint  Patient presents with   Medication Refill    HPI Glen Chambers is a 62 y.o. male here today for follow up visit. Glen Chambers   He reports that he is doing well.  He has done well since ablation and has not had recurrence of A. Fib that he has been aware of.  He continues on metoprolol  for rate control and is anticoagulated with eliquis .  BP remains well controlled with amlodipine  and candesartan /hydrochlorothiazide.  He has not had chest pain, shortness of breath, palpitations.   He continues to see outside clinic for testosterone  management.  Stable with current strength.  Also taking arimidex  for elevated estradiol  levels.    Tolerating crestor  and zetia  for lipid management.    ROS:  A comprehensive ROS was completed and negative except as noted per HPI  Allergies  Allergen Reactions   Lisinopril  Cough    Mild cough with lisinopril     Past Medical History:  Diagnosis Date   Hyperlipidemia    Hypertension    Median nerve injury    Melanoma of skin (HCC) 10/26/2018   Skin biopsy dermatology May 2020   OSA (obstructive sleep apnea) 09/11/2016   Uses CPAP every night    Ulnar nerve damage, right, initial encounter     Past Surgical History:  Procedure Laterality Date   A-FLUTTER ABLATION N/A 04/02/2023   Procedure: A-FLUTTER ABLATION;  Surgeon: Inocencio Soyla Lunger, MD;  Location: [facility];  Service: Cardiovascular;  Laterality: N/A;   BACK SURGERY     CARDIOVERSION N/A 12/29/2022   Procedure: CARDIOVERSION;  Surgeon: Mona Vinie BROCKS, MD;  Location: [facility];  Service: Cardiovascular;  Laterality: N/A;   CARPAL TUNNEL RELEASE     CARPAL TUNNEL WITH CUBITAL TUNNEL     HERNIA REPAIR     left  and right   INCISION AND DRAINAGE Left 09/22/2018   Procedure: INCISION AND DRAINAGE LEFT ELBOW SEROMA;  Surgeon: Sissy Cough, MD;  Location: [facility];  Service: Orthopedics;  Laterality: Left;   TEE WITHOUT CARDIOVERSION N/A 12/29/2022   Procedure: TRANSESOPHAGEAL ECHOCARDIOGRAM;  Surgeon: Mona Vinie BROCKS, MD;  Location[facility];  Service: Cardiovascular;  Laterality: N/A;   TOTAL HIP ARTHROPLASTY      Social History   Socioeconomic History   Marital status: Married    Spouse name: Not on file   Number of children: 0   Years of education: 16   Highest education level: Associate degree: academic program  Occupational History   Occupation: [employer]  Tobacco Use   Smoking status: Never   Smokeless tobacco: Never  Vaping Use   Vaping status: Never Used  Substance and Sexual Activity   Alcohol use: Yes    Comment: few beers on weekend   Drug use: No   Sexual activity: Not on file  Other Topics Concern   Not on file  Social History Narrative   No children   Drinks caffeine   Two story home   Right handed    Social Drivers of Health   Financial Resource Strain: Low Risk  (01/25/2024)   Overall Financial Resource Strain (CARDIA)    Difficulty of Paying Living Expenses: Not hard at all  Food Insecurity: No Food Insecurity (01/25/2024)   Hunger Vital Sign    Worried About Running Out of Food in the Last Year: Never true  Ran Out of Food in the Last Year: Never true  Transportation Needs: No Transportation Needs (01/25/2024)   PRAPARE - Administrator, Civil Service (Medical): No    Lack of Transportation (Non-Medical): No  Physical Activity: Sufficiently Active (01/25/2024)   Exercise Vital Sign    Days of Exercise per Week: 5 days    Minutes of Exercise per Session: 80 min  Stress: No Stress Concern Present (01/25/2024)   [name] - Occupational Stress Questionnaire    Feeling of Stress: Only a little  Social Connections: Moderately Integrated (01/25/2024)   Social Connection and Isolation Panel    Frequency of Communication with Friends and Family:  More than three times a week    Frequency of Social Gatherings with Friends and Family: Twice a week    Attends Religious Services: Patient declined    Database administrator or Organizations: Yes    Attends Engineer, structural: More than 4 times per year    Marital Status: Married    Family History  Problem Relation Age of Onset   Hypertension Mother    Heart attack Father    Heart disease Father    Hypertension Father     Health Maintenance  Topic Date Due   Zoster Vaccines- Shingrix (1 of 2) Never done   Fecal DNA (Cologuard)  Never done   Pneumococcal Vaccine: 50+ Years (1 of 1 - PCV) Never done   COVID-19 Vaccine (3 - Pfizer risk series) 10/31/2019   INFLUENZA VACCINE  09/13/2024 (Originally 01/15/2024)   DTaP/Tdap/Td (3 - Td or Tdap) 01/25/2034   Hepatitis C Screening  Completed   HIV Screening  Completed   Hepatitis B Vaccines  Aged Out   HPV VACCINES  Aged Out   Meningococcal B Vaccine  Aged Out     ----------------------------------------------------------------------------------------------------------------------------------------------------------------------------------------------------------------- Physical Exam BP 136/74 (BP Location: Left Arm, Patient Position: Sitting, Cuff Size: Normal)   Pulse 65   Resp 20   Ht 5' 9 (1.753 m)   Wt 235 lb (106.6 kg)   SpO2 99%   BMI 34.70 kg/m   Physical Exam Constitutional:      Appearance: Normal appearance.  HENT:     Head: Normocephalic and atraumatic.  Eyes:     General: No scleral icterus. Cardiovascular:     Rate and Rhythm: Normal rate and regular rhythm.  Pulmonary:     Effort: Pulmonary effort is normal.     Breath sounds: Normal breath sounds.  Neurological:     Mental Status: He is alert.  Psychiatric:        Mood and Affect: Mood normal.        Behavior: Behavior normal.      ------------------------------------------------------------------------------------------------------------------------------------------------------------------------------------------------------------------- Assessment and Plan  Low testosterone  in male Doing well with TRT.  Update testosterone , estradiol  and PSA levels.   HLD (hyperlipidemia) Continue crestor  40mg  daily along with zetia .  Update lipid panel.   Typical atrial flutter (HCC) S/p Ablation and is doing well. He will continue management per cardiology.   Anxiety Continue lexapro  at current strength.   Essential (primary) hypertension BP remains well controlled today.  Recommend continuation of current medications.  F/u in 6 months.    Meds ordered this encounter  Medications   amLODipine  (NORVASC ) 10 MG tablet    Sig: Take 1 tablet (10 mg total) by mouth daily.    Dispense:  90 tablet    Refill:  3   anastrozole  (ARIMIDEX ) 1 MG  tablet    Sig: TAKE 1 TABLET BY MOUTH EVERY OTHER DAY.    Dispense:  45 tablet    Refill:  3   Candesartan  Cilexetil-HCTZ 32-25 MG TABS    Sig: Take 1 tablet by mouth daily.    Dispense:  90 tablet    Refill:  2   escitalopram  (LEXAPRO ) 10 MG tablet    Sig: Take 1 tablet (10 mg total) by mouth daily.    Dispense:  90 tablet    Refill:  2    Return in about 6 months (around 07/28/2024) for Hypertension.

## 2024-01-26 NOTE — Assessment & Plan Note (Signed)
 Continue crestor  40mg  daily along with zetia .  Update lipid panel.

## 2024-01-27 ENCOUNTER — Encounter: Payer: Self-pay | Admitting: Family Medicine

## 2024-01-27 DIAGNOSIS — R7401 Elevation of levels of liver transaminase levels: Secondary | ICD-10-CM

## 2024-01-27 LAB — CMP14+EGFR
ALT: 81 IU/L — ABNORMAL HIGH (ref 0–44)
AST: 49 IU/L — ABNORMAL HIGH (ref 0–40)
Albumin: 4.7 g/dL (ref 3.9–4.9)
Alkaline Phosphatase: 65 IU/L (ref 44–121)
BUN/Creatinine Ratio: 11 (ref 10–24)
BUN: 15 mg/dL (ref 8–27)
Bilirubin Total: 0.7 mg/dL (ref 0.0–1.2)
CO2: 21 mmol/L (ref 20–29)
Calcium: 9.3 mg/dL (ref 8.6–10.2)
Chloride: 100 mmol/L (ref 96–106)
Creatinine, Ser: 1.33 mg/dL — ABNORMAL HIGH (ref 0.76–1.27)
Globulin, Total: 2 g/dL (ref 1.5–4.5)
Glucose: 100 mg/dL — ABNORMAL HIGH (ref 70–99)
Potassium: 4.3 mmol/L (ref 3.5–5.2)
Sodium: 138 mmol/L (ref 134–144)
Total Protein: 6.7 g/dL (ref 6.0–8.5)
eGFR: 60 mL/min/1.73 (ref >59)

## 2024-01-27 LAB — CBC WITH DIFFERENTIAL/PLATELET
Basophils Absolute: 0.1 x10E3/uL (ref 0.0–0.2)
Basos: 1 %
EOS (ABSOLUTE): 0.8 x10E3/uL — ABNORMAL HIGH (ref 0.0–0.4)
Eos: 11 %
Hematocrit: 54.6 % — ABNORMAL HIGH (ref 37.5–51.0)
Hemoglobin: 18.1 g/dL — ABNORMAL HIGH (ref 13.0–17.7)
Immature Grans (Abs): 0 x10E3/uL (ref 0.0–0.1)
Immature Granulocytes: 0 %
Lymphocytes Absolute: 1.3 x10E3/uL (ref 0.7–3.1)
Lymphs: 18 %
MCH: 32.3 pg (ref 26.6–33.0)
MCHC: 33.2 g/dL (ref 31.5–35.7)
MCV: 97 fL (ref 79–97)
Monocytes Absolute: 0.8 x10E3/uL (ref 0.1–0.9)
Monocytes: 11 %
Neutrophils Absolute: 4.2 x10E3/uL (ref 1.4–7.0)
Neutrophils: 59 %
Platelets: 195 x10E3/uL (ref 150–450)
RBC: 5.61 x10E6/uL (ref 4.14–5.80)
RDW: 12.7 % (ref 11.6–15.4)
WBC: 7.3 x10E3/uL (ref 3.4–10.8)

## 2024-01-27 LAB — LIPID PANEL WITH LDL/HDL RATIO
Cholesterol, Total: 114 mg/dL (ref 100–199)
HDL: 34 mg/dL — ABNORMAL LOW (ref >39)
LDL Chol Calc (NIH): 57 mg/dL (ref 0–99)
LDL/HDL Ratio: 1.7 ratio (ref 0.0–3.6)
Triglycerides: 129 mg/dL (ref 0–149)
VLDL Cholesterol Cal: 23 mg/dL (ref 5–40)

## 2024-01-27 LAB — PSA: Prostate Specific Ag, Serum: 3.9 ng/mL (ref 0.0–4.0)

## 2024-01-27 LAB — TSH: TSH: 0.771 u[IU]/mL (ref 0.450–4.500)

## 2024-01-27 LAB — ESTRADIOL: Estradiol: <5 pg/mL — ABNORMAL LOW (ref 7.6–42.6)

## 2024-01-27 LAB — TESTOSTERONE: Testosterone: 381 ng/dL (ref 264–916)

## 2024-01-29 ENCOUNTER — Ambulatory Visit: Payer: Self-pay | Admitting: Family Medicine

## 2024-01-29 DIAGNOSIS — R7989 Other specified abnormal findings of blood chemistry: Secondary | ICD-10-CM

## 2024-02-11 ENCOUNTER — Other Ambulatory Visit

## 2024-02-12 ENCOUNTER — Ambulatory Visit: Payer: Self-pay | Admitting: Family Medicine

## 2024-02-12 ENCOUNTER — Ambulatory Visit (INDEPENDENT_AMBULATORY_CARE_PROVIDER_SITE_OTHER)

## 2024-02-12 DIAGNOSIS — R7989 Other specified abnormal findings of blood chemistry: Secondary | ICD-10-CM

## 2024-02-12 LAB — HEPATIC FUNCTION PANEL
ALT: 61 IU/L — ABNORMAL HIGH (ref 0–44)
AST: 48 IU/L — ABNORMAL HIGH (ref 0–40)
Albumin: 4.4 g/dL (ref 3.9–4.9)
Alkaline Phosphatase: 56 IU/L (ref 44–121)
Bilirubin Total: 0.7 mg/dL (ref 0.0–1.2)
Bilirubin, Direct: 0.28 mg/dL (ref 0.00–0.40)
Total Protein: 6.6 g/dL (ref 6.0–8.5)

## 2024-02-16 ENCOUNTER — Encounter: Payer: Self-pay | Admitting: Sports Medicine

## 2024-02-17 ENCOUNTER — Ambulatory Visit: Payer: Self-pay | Admitting: Family Medicine

## 2024-03-07 ENCOUNTER — Encounter: Payer: Self-pay | Admitting: Family Medicine

## 2024-03-07 DIAGNOSIS — Z1211 Encounter for screening for malignant neoplasm of colon: Secondary | ICD-10-CM

## 2024-03-08 NOTE — Telephone Encounter (Signed)
 Pended referral for GI for colonoscopy

## 2024-03-26 ENCOUNTER — Other Ambulatory Visit: Payer: Self-pay | Admitting: Cardiology

## 2024-03-26 DIAGNOSIS — E782 Mixed hyperlipidemia: Secondary | ICD-10-CM

## 2024-04-15 ENCOUNTER — Other Ambulatory Visit: Payer: Self-pay | Admitting: Cardiology

## 2024-04-15 DIAGNOSIS — E782 Mixed hyperlipidemia: Secondary | ICD-10-CM

## 2024-04-25 ENCOUNTER — Other Ambulatory Visit: Payer: Self-pay | Admitting: Family Medicine

## 2024-04-30 ENCOUNTER — Other Ambulatory Visit: Payer: Self-pay | Admitting: Cardiology

## 2024-04-30 DIAGNOSIS — E782 Mixed hyperlipidemia: Secondary | ICD-10-CM

## 2024-05-18 ENCOUNTER — Other Ambulatory Visit: Payer: Self-pay | Admitting: Cardiology

## 2024-05-18 DIAGNOSIS — E782 Mixed hyperlipidemia: Secondary | ICD-10-CM

## 2024-06-07 ENCOUNTER — Other Ambulatory Visit: Payer: Self-pay | Admitting: Cardiology

## 2024-06-07 DIAGNOSIS — E782 Mixed hyperlipidemia: Secondary | ICD-10-CM

## 2024-06-07 NOTE — Telephone Encounter (Signed)
 Please schedule overdue yearly appt for future refills [phone]. Thank you Last attempt

## 2024-06-23 ENCOUNTER — Other Ambulatory Visit: Payer: Self-pay | Admitting: Cardiology

## 2024-06-23 DIAGNOSIS — E782 Mixed hyperlipidemia: Secondary | ICD-10-CM

## 2024-06-23 NOTE — Telephone Encounter (Signed)
 Left message for pt to call.

## 2024-06-23 NOTE — Telephone Encounter (Signed)
 Pt of Dr. Pietro. Passed 3rd attempt. Does Dr. Pietro want to refill? Please advise.

## 2024-06-25 ENCOUNTER — Other Ambulatory Visit: Payer: Self-pay | Admitting: Cardiology

## 2024-06-25 DIAGNOSIS — I483 Typical atrial flutter: Secondary | ICD-10-CM

## 2024-06-26 ENCOUNTER — Other Ambulatory Visit: Payer: Self-pay | Admitting: Cardiology

## 2024-06-26 DIAGNOSIS — E782 Mixed hyperlipidemia: Secondary | ICD-10-CM

## 2024-06-27 ENCOUNTER — Other Ambulatory Visit: Payer: Self-pay | Admitting: Cardiology

## 2024-06-27 DIAGNOSIS — I483 Typical atrial flutter: Secondary | ICD-10-CM

## 2024-06-27 NOTE — Telephone Encounter (Signed)
 Pt last saw Daphne Barrack, NP on 04/30/23, pt is overdue for follow-up.  Pt was due to see Dr Inocencio in 6 months for follow-up.  Msg sent to schedulers to contact pt for follow-up appt. Last labs 01/26/24 Creat 1.33, age 64, weight 106.6kg, based on specified criteria pt is on appropriate dosage of Eliquis  5mg  BID for aflutter.  Will await appt to refill rx.

## 2024-06-28 NOTE — Telephone Encounter (Signed)
 Pt of Dr. Pietro. Last O/V on 12/24/22. Pt has past 3 attempts. Does Dr. Pietro want to refill? Please advise.

## 2024-06-29 NOTE — Telephone Encounter (Signed)
 Pending appt on 09/21/24 at 4p with Dr. Pietro Age-64 yrs old, wt-106.6kg, crea-1.33 on 01/26/24, dx-aflutter, last ov 04/30/23

## 2024-06-30 ENCOUNTER — Encounter: Payer: Self-pay | Admitting: Cardiology

## 2024-06-30 DIAGNOSIS — E782 Mixed hyperlipidemia: Secondary | ICD-10-CM

## 2024-06-30 MED ORDER — EZETIMIBE 10 MG PO TABS: 10.0000 mg | ORAL_TABLET | Freq: Every day | ORAL | 0 refills | Status: AC

## 2024-07-22 ENCOUNTER — Other Ambulatory Visit: Payer: Self-pay | Admitting: Cardiology

## 2024-07-22 DIAGNOSIS — I483 Typical atrial flutter: Secondary | ICD-10-CM

## 2024-07-28 ENCOUNTER — Ambulatory Visit: Admitting: Family Medicine

## 2024-09-21 ENCOUNTER — Ambulatory Visit: Admitting: Cardiology
